# Patient Record
Sex: Female | Born: 1965 | Race: Black or African American | Hispanic: No | Marital: Married | Smoking: Former smoker
Health system: Southern US, Community
[De-identification: ages and names within clinical notes are randomized; demographics above are authoritative.]

## PROBLEM LIST (undated history)

## (undated) DIAGNOSIS — M1990 Unspecified osteoarthritis, unspecified site: Secondary | ICD-10-CM

## (undated) DIAGNOSIS — I10 Essential (primary) hypertension: Secondary | ICD-10-CM

## (undated) DIAGNOSIS — T7840XA Allergy, unspecified, initial encounter: Secondary | ICD-10-CM

## (undated) DIAGNOSIS — D649 Anemia, unspecified: Secondary | ICD-10-CM

## (undated) DIAGNOSIS — R7303 Prediabetes: Secondary | ICD-10-CM

## (undated) HISTORY — DX: Essential (primary) hypertension: I10

## (undated) HISTORY — DX: Anemia, unspecified: D64.9

## (undated) HISTORY — PX: BREAST BIOPSY: SHX20

## (undated) HISTORY — DX: Allergy, unspecified, initial encounter: T78.40XA

## (undated) HISTORY — PX: BREAST CYST ASPIRATION: SHX578

---
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
## 2005-01-02 HISTORY — PX: TUBAL LIGATION: SHX77

## 2010-12-16 ENCOUNTER — Emergency Department: Payer: Self-pay | Admitting: Emergency Medicine

## 2013-10-08 ENCOUNTER — Encounter: Payer: Self-pay | Admitting: General Surgery

## 2013-10-08 ENCOUNTER — Ambulatory Visit (INDEPENDENT_AMBULATORY_CARE_PROVIDER_SITE_OTHER): Payer: Managed Care, Other (non HMO) | Admitting: General Surgery

## 2013-10-08 VITALS — BP 124/68 | HR 74 | Resp 12 | Ht 66.0 in | Wt 126.0 lb

## 2013-10-08 DIAGNOSIS — L732 Hidradenitis suppurativa: Secondary | ICD-10-CM

## 2013-10-08 NOTE — Patient Instructions (Signed)
Patient to follow up in 3-4 weeks. Patient instructed to keep the area cleaned and trimmed with a hair trimmer. She was also instructed to avoid deodorant.   Hidradenitis Suppurativa, Sweat Gland Abscess Hidradenitis suppurativa is a long lasting (chronic), uncommon disease of the sweat glands. With this, boil-like lumps and scarring develop in the groin, some times under the arms (axillae), and under the breasts. It may also uncommonly occur behind the ears, in the crease of the buttocks, and around the genitals.  CAUSES  The cause is from a blocking of the sweat glands. They then become infected. It may cause drainage and odor. It is not contagious. So it cannot be given to someone else. It most often shows up in puberty (about 5410 to 50 years of age). But it may happen much later. It is similar to acne which is a disease of the sweat glands. This condition is slightly more common in African-Americans and women. SYMPTOMS   Hidradenitis usually starts as one or more red, tender, swellings in the groin or under the arms (axilla).  Over a period of hours to days the lesions get larger. They often open to the skin surface, draining clear to yellow-colored fluid.  The infected area heals with scarring. DIAGNOSIS  Your caregiver makes this diagnosis by looking at you. Sometimes cultures (growing germs on plates in the lab) may be taken. This is to see what germ (bacterium) is causing the infection.  TREATMENT   Topical germ killing medicine applied to the skin (antibiotics) are the treatment of choice. Antibiotics taken by mouth (systemic) are sometimes needed when the condition is getting worse or is severe.  Avoid tight-fitting clothing which traps moisture in.  Dirt does not cause hidradenitis and it is not caused by poor hygiene.  Involved areas should be cleaned daily using an antibacterial soap. Some patients find that the liquid form of Lever 2000, applied to the involved areas as a lotion  after bathing, can help reduce the odor related to this condition.  Sometimes surgery is needed to drain infected areas or remove scarred tissue. Removal of large amounts of tissue is used only in severe cases.  Birth control pills may be helpful.  Oral retinoids (vitamin A derivatives) for 6 to 12 months which are effective for acne may also help this condition.  Weight loss will improve but not cure hidradenitis. It is made worse by being overweight. But the condition is not caused by being overweight.  This condition is more common in people who have had acne.  It may become worse under stress. There is no medical cure for hidradenitis. It can be controlled, but not cured. The condition usually continues for years with periods of getting worse and getting better (remission). Document Released: 08/03/2003 Document Revised: 03/13/2011 Document Reviewed: 03/21/2013 [facility] Patient Information 2015 [employer], [place]. This information is not intended to replace advice given to you by your health care provider. Make sure you discuss any questions you have with your health care provider.

## 2013-10-08 NOTE — Progress Notes (Signed)
Patient ID: Sylvia Beasley, female   DOB: 05/23/1965, 48 y.o.   MRN: [id number]  Chief Complaint  Patient presents with  . Other    left axilla infection    HPI Sylvia Beasley is a 48 y.o. female here today for bilateral axilla infection. Patient states she is currently taking Keflex four times daily for the past two days. They have been inflammed on and off for about month now.  HPI  Past Medical History  Diagnosis Date  . Hypertension     Past Surgical History  Procedure Laterality Date  . Tubal ligation  2007  . Breast cyst aspiration Left     Family History  Problem Relation Age of Onset  . Breast cancer Sister     Social History History  Substance Use Topics  . Smoking status: Former Smoker    Quit date: 01/03/1988  . Smokeless tobacco: Not on file  . Alcohol Use: No    Allergies  Allergen Reactions  . Zyrtec [Cetirizine] Shortness Of Breath  . Shellfish Allergy     Current Outpatient Prescriptions  Medication Sig Dispense Refill  . cephALEXin (KEFLEX) 500 MG capsule Take 500 mg by mouth 4 (four) times daily.       Marland Kitchen. CHERATUSSIN AC 100-10 MG/5ML syrup Take 5 mLs by mouth.       . cholecalciferol (VITAMIN D) 1000 UNITS tablet Take 1,000 Units by mouth.      . co-enzyme Q-10 30 MG capsule Take 100 mg by mouth daily.      . Evening Primrose Oil 500 MG CAPS Take by mouth.      . loratadine (CLARITIN) 10 MG tablet Take 10 mg by mouth.      . metoprolol succinate (TOPROL-XL) 25 MG 24 hr tablet Take 25 mg by mouth daily.       Marland Kitchen. triamcinolone cream (KENALOG) 0.5 % Apply topically.      . vitamin E 400 UNIT capsule Take 400 Units by mouth daily.       No current facility-administered medications for this visit.    Review of Systems Review of Systems  Constitutional: Negative.   Respiratory: Negative.   Cardiovascular: Negative.     Blood pressure 124/68, pulse 74, resp. rate 12, height 5\' 6"  (1.676 m), weight 126 lb (57.153 kg), last menstrual period  09/16/2013.  Physical Exam Physical Exam  Constitutional: She is oriented to person, place, and time. She appears well-developed and well-nourished.  Cardiovascular: Normal rate, regular rhythm and normal heart sounds.   Pulmonary/Chest: Effort normal and breath sounds normal.  Lymphadenopathy:    She has no cervical adenopathy.    She has no axillary adenopathy.  Neurological: She is alert and oriented to person, place, and time.  Skin: Skin is warm and dry.  Left axilla with two small 1 cm areas of active hidradenitis. Right axilla with 2x1cm area of heald hidradenitis with thickened scar.     Data Reviewed None  Assessment    Hidradenitis both axilla. Left one active at present.     Plan    Complete course of antibiotic. Local hygiene discussed fully.        Tashe Purdon G 10/08/2013, 3:52 PM

## 2013-10-30 ENCOUNTER — Ambulatory Visit (INDEPENDENT_AMBULATORY_CARE_PROVIDER_SITE_OTHER): Payer: Managed Care, Other (non HMO) | Admitting: General Surgery

## 2013-10-30 ENCOUNTER — Encounter: Payer: Self-pay | Admitting: General Surgery

## 2013-10-30 VITALS — BP 138/82 | HR 68 | Resp 12 | Ht 66.0 in | Wt 130.0 lb

## 2013-10-30 DIAGNOSIS — L732 Hidradenitis suppurativa: Secondary | ICD-10-CM

## 2013-10-30 NOTE — Patient Instructions (Signed)
Patient to return as needed. 

## 2013-10-30 NOTE — Progress Notes (Signed)
Patient ID: Wyline Moodoni Schwinn, female   DOB: 03/25/1965, 48 y.o.   MRN: [id number]  Chief Complaint  Patient presents with  . Follow-up    hidradenitis    HPI Wyline Moodoni Kruszka is a 48 y.o. female here today following up with hidradenitis. She states the left axilla has healed up but the right is still there with no drainage.   HPI  Past Medical History  Diagnosis Date  . Hypertension     Past Surgical History  Procedure Laterality Date  . Tubal ligation  2007  . Breast cyst aspiration Left     Family History  Problem Relation Age of Onset  . Breast cancer Sister     Social History History  Substance Use Topics  . Smoking status: Former Smoker    Quit date: 01/03/1988  . Smokeless tobacco: Never Used  . Alcohol Use: No    Allergies  Allergen Reactions  . Zyrtec [Cetirizine] Shortness Of Breath  . Shellfish Allergy     Current Outpatient Prescriptions  Medication Sig Dispense Refill  . cephALEXin (KEFLEX) 500 MG capsule Take 500 mg by mouth 4 (four) times daily.       Marland Kitchen. CHERATUSSIN AC 100-10 MG/5ML syrup Take 5 mLs by mouth.       . cholecalciferol (VITAMIN D) 1000 UNITS tablet Take 1,000 Units by mouth.      . co-enzyme Q-10 30 MG capsule Take 100 mg by mouth daily.      . Evening Primrose Oil 500 MG CAPS Take by mouth.      . loratadine (CLARITIN) 10 MG tablet Take 10 mg by mouth.      . metoprolol succinate (TOPROL-XL) 25 MG 24 hr tablet Take 25 mg by mouth daily.       Marland Kitchen. triamcinolone cream (KENALOG) 0.5 % Apply topically.      . vitamin E 400 UNIT capsule Take 400 Units by mouth daily.       No current facility-administered medications for this visit.    Review of Systems Review of Systems  Constitutional: Negative.   Respiratory: Negative.   Cardiovascular: Negative.     Blood pressure 138/82, pulse 68, resp. rate 12, height 5\' 6"  (1.676 m), weight 130 lb (58.968 kg), last menstrual period 09/16/2013.  Physical Exam Physical Exam  Constitutional: She is  oriented to person, place, and time. She appears well-developed and well-nourished.  Neurological: She is alert and oriented to person, place, and time.  Skin: Skin is warm and dry.  Left axilla -no drainage, redness or induration. Right axilla-one healed scar with mild keloid like change.  Data Reviewed None  Assessment    Very limited area of skin involvement in both axillae. Pt has mils discomfort with the scar in right axilla. Discussed excision but given the keloid like scar, it may recur. Pt will think about it.    Plan    Patient to return as needed. Discussed option about removing the right axillary keloid.       SANKAR,SEEPLAPUTHUR G 10/30/2013, 5:14 PM

## 2013-11-04 ENCOUNTER — Encounter: Payer: Self-pay | Admitting: General Surgery

## 2013-11-14 ENCOUNTER — Ambulatory Visit: Payer: Self-pay | Admitting: Gastroenterology

## 2013-12-05 ENCOUNTER — Ambulatory Visit: Payer: Self-pay | Admitting: Gastroenterology

## 2014-01-05 ENCOUNTER — Ambulatory Visit: Payer: Self-pay | Admitting: Surgery

## 2014-04-28 ENCOUNTER — Other Ambulatory Visit: Payer: Self-pay

## 2014-04-28 ENCOUNTER — Other Ambulatory Visit: Payer: Self-pay | Admitting: Internal Medicine

## 2014-04-28 DIAGNOSIS — Z1231 Encounter for screening mammogram for malignant neoplasm of breast: Secondary | ICD-10-CM

## 2014-05-15 ENCOUNTER — Other Ambulatory Visit: Payer: Self-pay | Admitting: Internal Medicine

## 2014-05-15 ENCOUNTER — Ambulatory Visit
Admission: RE | Admit: 2014-05-15 | Discharge: 2014-05-15 | Disposition: A | Discharge status: Home / Self Care | Payer: Managed Care, Other (non HMO) | Source: Ambulatory Visit | Attending: Internal Medicine | Admitting: Internal Medicine

## 2014-05-15 DIAGNOSIS — Z1231 Encounter for screening mammogram for malignant neoplasm of breast: Secondary | ICD-10-CM | POA: Insufficient documentation

## 2014-12-18 ENCOUNTER — Other Ambulatory Visit: Payer: Self-pay | Admitting: Internal Medicine

## 2014-12-18 DIAGNOSIS — R928 Other abnormal and inconclusive findings on diagnostic imaging of breast: Secondary | ICD-10-CM

## 2014-12-29 ENCOUNTER — Ambulatory Visit
Admission: RE | Admit: 2014-12-29 | Discharge: 2014-12-29 | Disposition: A | Discharge status: Home / Self Care | Payer: Managed Care, Other (non HMO) | Source: Ambulatory Visit | Attending: Internal Medicine | Admitting: Internal Medicine

## 2014-12-29 DIAGNOSIS — R928 Other abnormal and inconclusive findings on diagnostic imaging of breast: Secondary | ICD-10-CM

## 2014-12-29 DIAGNOSIS — N6002 Solitary cyst of left breast: Secondary | ICD-10-CM | POA: Insufficient documentation

## 2015-01-01 ENCOUNTER — Other Ambulatory Visit: Payer: Managed Care, Other (non HMO)

## 2015-04-28 ENCOUNTER — Other Ambulatory Visit: Payer: Self-pay | Admitting: Internal Medicine

## 2015-04-28 DIAGNOSIS — Z1231 Encounter for screening mammogram for malignant neoplasm of breast: Secondary | ICD-10-CM

## 2015-05-17 ENCOUNTER — Other Ambulatory Visit: Payer: Self-pay | Admitting: Internal Medicine

## 2015-05-17 ENCOUNTER — Ambulatory Visit
Admission: RE | Admit: 2015-05-17 | Discharge: 2015-05-17 | Disposition: A | Discharge status: Home / Self Care | Payer: Managed Care, Other (non HMO) | Source: Ambulatory Visit | Attending: Internal Medicine | Admitting: Internal Medicine

## 2015-05-17 DIAGNOSIS — N63 Unspecified lump in unspecified breast: Secondary | ICD-10-CM

## 2015-05-17 DIAGNOSIS — Z1231 Encounter for screening mammogram for malignant neoplasm of breast: Secondary | ICD-10-CM

## 2015-05-26 ENCOUNTER — Other Ambulatory Visit: Payer: Managed Care, Other (non HMO)

## 2015-05-26 ENCOUNTER — Ambulatory Visit: Payer: Managed Care, Other (non HMO) | Attending: Internal Medicine

## 2015-06-11 ENCOUNTER — Ambulatory Visit
Admission: RE | Admit: 2015-06-11 | Discharge: 2015-06-11 | Disposition: A | Discharge status: Home / Self Care | Payer: Managed Care, Other (non HMO) | Source: Ambulatory Visit | Attending: Internal Medicine | Admitting: Internal Medicine

## 2015-06-11 DIAGNOSIS — N63 Unspecified lump in unspecified breast: Secondary | ICD-10-CM

## 2015-06-11 DIAGNOSIS — N6002 Solitary cyst of left breast: Secondary | ICD-10-CM | POA: Diagnosis not present

## 2015-06-15 ENCOUNTER — Other Ambulatory Visit: Payer: Self-pay | Admitting: Internal Medicine

## 2015-06-15 DIAGNOSIS — N6002 Solitary cyst of left breast: Secondary | ICD-10-CM

## 2016-04-24 ENCOUNTER — Other Ambulatory Visit: Payer: Self-pay | Admitting: Internal Medicine

## 2016-04-24 DIAGNOSIS — N6320 Unspecified lump in the left breast, unspecified quadrant: Secondary | ICD-10-CM

## 2016-05-04 ENCOUNTER — Ambulatory Visit
Admission: RE | Admit: 2016-05-04 | Discharge: 2016-05-04 | Disposition: A | Discharge status: Home / Self Care | Payer: Managed Care, Other (non HMO) | Source: Ambulatory Visit | Attending: Internal Medicine | Admitting: Internal Medicine

## 2016-05-04 DIAGNOSIS — N6320 Unspecified lump in the left breast, unspecified quadrant: Secondary | ICD-10-CM | POA: Diagnosis not present

## 2016-11-20 ENCOUNTER — Encounter: Payer: Self-pay | Admitting: Oncology

## 2016-11-20 ENCOUNTER — Other Ambulatory Visit: Payer: Self-pay

## 2016-11-20 ENCOUNTER — Inpatient Hospital Stay: Payer: Managed Care, Other (non HMO)

## 2016-11-20 ENCOUNTER — Inpatient Hospital Stay: Payer: Managed Care, Other (non HMO) | Attending: Oncology | Admitting: Oncology

## 2016-11-20 VITALS — BP 170/84 | HR 66 | Temp 96.9°F | Ht 63.0 in | Wt 136.5 lb

## 2016-11-20 DIAGNOSIS — D709 Neutropenia, unspecified: Secondary | ICD-10-CM | POA: Diagnosis not present

## 2016-11-20 DIAGNOSIS — Z79899 Other long term (current) drug therapy: Secondary | ICD-10-CM | POA: Diagnosis not present

## 2016-11-20 DIAGNOSIS — D72819 Decreased white blood cell count, unspecified: Secondary | ICD-10-CM

## 2016-11-20 DIAGNOSIS — Z87891 Personal history of nicotine dependence: Secondary | ICD-10-CM | POA: Diagnosis not present

## 2016-11-20 DIAGNOSIS — I10 Essential (primary) hypertension: Secondary | ICD-10-CM | POA: Diagnosis not present

## 2016-11-20 DIAGNOSIS — R5383 Other fatigue: Secondary | ICD-10-CM | POA: Diagnosis not present

## 2016-11-20 LAB — IRON AND TIBC
Iron: 82 ug/dL (ref 28–170)
Saturation Ratios: 20 % (ref 10.4–31.8)
TIBC: 407 ug/dL (ref 250–450)
UIBC: 325 ug/dL

## 2016-11-20 LAB — COMPREHENSIVE METABOLIC PANEL
ALT: 16 U/L (ref 14–54)
AST: 25 U/L (ref 15–41)
Albumin: 4.6 g/dL (ref 3.5–5.0)
Alkaline Phosphatase: 58 U/L (ref 38–126)
Anion gap: 6 (ref 5–15)
BUN: 11 mg/dL (ref 6–20)
CO2: 31 mmol/L (ref 22–32)
Calcium: 9.6 mg/dL (ref 8.9–10.3)
Chloride: 100 mmol/L — ABNORMAL LOW (ref 101–111)
Creatinine, Ser: 0.98 mg/dL (ref 0.44–1.00)
GFR calc Af Amer: >60 mL/min (ref >60)
GFR calc non Af Amer: >60 mL/min (ref >60)
Glucose, Bld: 97 mg/dL (ref 65–99)
Potassium: 3.7 mmol/L (ref 3.5–5.1)
Sodium: 137 mmol/L (ref 135–145)
Total Bilirubin: 0.6 mg/dL (ref 0.3–1.2)
Total Protein: 8.4 g/dL — ABNORMAL HIGH (ref 6.5–8.1)

## 2016-11-20 LAB — CBC WITH DIFFERENTIAL/PLATELET
Basophils Absolute: 0.1 10*3/uL (ref 0–0.1)
Basophils Relative: 2 %
Eosinophils Absolute: 0.1 10*3/uL (ref 0–0.7)
Eosinophils Relative: 4 %
HCT: 39.5 % (ref 35.0–47.0)
Hemoglobin: 12.9 g/dL (ref 12.0–16.0)
Lymphocytes Relative: 36 %
Lymphs Abs: 1.2 10*3/uL (ref 1.0–3.6)
MCH: 27.2 pg (ref 26.0–34.0)
MCHC: 32.5 g/dL (ref 32.0–36.0)
MCV: 83.5 fL (ref 80.0–100.0)
Monocytes Absolute: 0.2 10*3/uL (ref 0.2–0.9)
Monocytes Relative: 6 %
Neutro Abs: 1.7 10*3/uL (ref 1.4–6.5)
Neutrophils Relative %: 52 %
Platelets: 332 10*3/uL (ref 150–440)
RBC: 4.73 MIL/uL (ref 3.80–5.20)
RDW: 14.7 % — ABNORMAL HIGH (ref 11.5–14.5)
WBC: 3.3 10*3/uL — ABNORMAL LOW (ref 3.6–11.0)

## 2016-11-20 LAB — FOLATE: Folate: 58.4 ng/mL (ref >5.9)

## 2016-11-20 LAB — FERRITIN: Ferritin: 30 ng/mL (ref 11–307)

## 2016-11-20 LAB — LACTATE DEHYDROGENASE: LDH: 175 U/L (ref 98–192)

## 2016-11-20 NOTE — Progress Notes (Signed)
Hematology/Oncology Consult note [facility] [phone] Fax:([phone]   [facility] Sherrie MustacheJadali, Fayegh, MD as PCP - General (Internal Medicine) Sherrie MustacheJadali, Fayegh, MD (Internal Medicine) Kieth BrightlySankar, Seeplaputhur G, MD (General Surgery)  REFERRING PROVIDER: Sherrie MustacheJadali, Fayegh, MD  CHIEF COMPLAINTS/PURPOSE OF CONSULTATION:  Evaluation of Leukopenia.  HISTORY OF PRESENTING ILLNESS:  Sylvia Beasley is a  51 y.o.  female with PMH listed below who was referred to me for evaluation of leukopenia. Patient reports that he has been feeling fatigued and she went to see primary care physician and found out that her leukocytes her low. She started taking over-the-counter herbal supplement called Cat's claw and has felt improvement of her fatigue otherwise denies any frequent infections, fever or chills.   per patient she had a lymph node under left axillary and underwent extensive workup with mammogram and ultrasound with benign finding.  Review of Systems  Constitutional: Negative for chills, fever and malaise/fatigue.  HENT: Negative for hearing loss.   Eyes: Negative for blurred vision.  Cardiovascular: Negative for chest pain.  Gastrointestinal: Negative for heartburn.  Genitourinary: Negative for dysuria.  Skin: Negative for rash.  Neurological: Negative for dizziness.  Endo/Heme/Allergies: Does not bruise/bleed easily.  Psychiatric/Behavioral: Negative for depression.    MEDICAL HISTORY:  Past Medical History:  Diagnosis Date  . Hypertension     SURGICAL HISTORY: Past Surgical History:  Procedure Laterality Date  . BREAST CYST ASPIRATION Left    x 2  . TUBAL LIGATION  2007    SOCIAL HISTORY: Social History   Socioeconomic History  . Marital status: Married    Spouse name: Not on file  . Number of children: Not on file  . Years of education: Not on file  . Highest education level: Not on file  Social Needs  . Financial resource strain:  Not on file  . Food insecurity - worry: Not on file  . Food insecurity - inability: Not on file  . Transportation needs - medical: Not on file  . Transportation needs - non-medical: Not on file  Occupational History  . Not on file  Tobacco Use  . Smoking status: Former Smoker    Last attempt to quit: 01/03/1988    Years since quitting: 28.9  . Smokeless tobacco: Never Used  Substance and Sexual Activity  . Alcohol use: No  . Drug use: No  . Sexual activity: Not on file  Other Topics Concern  . Not on file  Social History Narrative  . Not on file    FAMILY HISTORY: Family History  Problem Relation Age of Onset  . Breast cancer Sister [id number]    ALLERGIES:  is allergic to zyrtec [cetirizine]; milk-related compounds; shellfish allergy; fexofenadine; and rofecoxib.  MEDICATIONS:  Current Outpatient Medications  Medication Sig Dispense Refill  . BLACK COHOSH PO Take by mouth.    . Cats Claw, Uncaria tomentosa, (CATS CLAW PO) Take by mouth.    . cholecalciferol (VITAMIN D) 1000 UNITS tablet Take 1,000 Units by mouth.    . COLLAGEN PO Take by mouth.    . Evening Primrose Oil 500 MG CAPS Take by mouth.    . loratadine (CLARITIN) 10 MG tablet Take 10 mg by mouth.    Marland Kitchen. MACA ROOT PO Take by mouth.    . metoprolol succinate (TOPROL-XL) 25 MG 24 hr tablet Take 25 mg by mouth daily.     . Multiple Vitamins-Minerals (MULTIVITAMIN ADULT PO) Take by mouth.    . cephALEXin (KEFLEX) 500 MG  capsule Take 500 mg by mouth 4 (four) times daily.     Marland Kitchen. co-enzyme Q-10 30 MG capsule Take 100 mg by mouth daily.    Marland Kitchen. triamcinolone cream (KENALOG) 0.5 % Apply topically.    . vitamin E 400 UNIT capsule Take 400 Units by mouth daily.     No current facility-administered medications for this visit.      PHYSICAL EXAMINATION: ECOG PERFORMANCE STATUS: 1 - Symptomatic but completely ambulatory Vitals:   11/20/16 1129  BP: (!) 170/84  Pulse: 66  Temp: (!) 96.9 F (36.1 C)   Filed Weights   11/20/16  1129  Weight: 136 lb 8 oz (61.9 kg)    GENERAL: No distress, well nourished.  SKIN:  No rashes or significant lesions  HEAD: Normocephalic, No masses, lesions, tenderness or abnormalities  EYES: Conjunctiva are pink, non icteric ENT: External ears normal ,lips , buccal mucosa, and tongue normal and mucous membranes are moist  LYMPH: No palpable cervical and axillary lymphadenopathy  LUNGS: Clear to auscultation, no crackles or wheezes HEART: Regular rate & rhythm, no murmurs, no gallops, S1 normal and S2 normal  ABDOMEN: Abdomen soft, non-tender, normal bowel sounds, I did not appreciate any  masses or organomegaly  MUSCULOSKELETAL: No CVA tenderness and no tenderness on percussion of the back or rib cage.  EXTREMITIES: No edema, no skin discoloration or tenderness NEURO: Alert & oriented, no focal motor/sensory deficits.    LABORATORY DATA:  I have reviewed the data as listed Lab Results  Component Value Date   WBC 3.3 (L) 11/20/2016   HGB 12.9 11/20/2016   HCT 39.5 11/20/2016   MCV 83.5 11/20/2016   PLT 332 11/20/2016   Recent Labs    11/20/16 1210  NA 137  K 3.7  CL 100*  CO2 31  GLUCOSE 97  BUN 11  CREATININE 0.98  CALCIUM 9.6  GFRNONAA >60  GFRAA >60  PROT 8.4*  ALBUMIN 4.6  AST 25  ALT 16  ALKPHOS 58  BILITOT 0.6       ASSESSMENT & PLAN:  1. Leukopenia, unspecified type    Discussed with patient about her lab results. She has leukopenia was predominantly neutropenia   with ANC of 1.2  I will repeat CBC with differential, CMP, LDH, folate, iron TIBC, ferritin, B12, flow cytometry panel. Discussed with patient that mild low neutrophil counts can be due to benign etiology such as ethnic neutropenia. Continue monitor. All questions were answered. The patient knows to call the clinic with any problems questions or concerns.  Return of visit:  2 weeks to discuss about results.  Thank you for this kind referral and the opportunity to participate in the  care of this patient. A copy of today's note is routed to referring provider    Rickard PatienceZhou Annais Crafts, MD, PhD Hematology [facility] Pager- [phone] 11/20/2016

## 2016-11-21 LAB — VITAMIN B12: Vitamin B-12: 956 pg/mL — ABNORMAL HIGH (ref 180–914)

## 2016-11-24 LAB — COMP PANEL: LEUKEMIA/LYMPHOMA

## 2016-12-03 NOTE — Progress Notes (Addendum)
Hematology/Oncology Follow up note [facility] Telephone:(336) [phone] Fax:(336) [phone]   Patient Care Team: Sherrie MustacheJadali, Fayegh, MD as PCP - General (Internal Medicine) Sherrie MustacheJadali, Fayegh, MD (Internal Medicine) Kieth BrightlySankar, Seeplaputhur G, MD (General Surgery)  REFERRING PROVIDER: Sherrie MustacheJadali, Fayegh, MD  CHIEF COMPLAINTS/PURPOSE OF CONSULTATION:  Evaluation of Leukopenia.  HISTORY OF PRESENTING ILLNESS:  Sylvia Beasley is a  51 y.o.  female with PMH listed below who was referred to me for evaluation of leukopenia. Patient reports that he has been feeling fatigued and she went to see primary care physician and found out that her leukocytes her low. She started taking over-the-counter herbal supplement called Cat's claw and has felt improvement of her fatigue otherwise denies any frequent infections, fever or chills.   per patient she had a lymph node under left axillary and underwent extensive workup with mammogram and ultrasound with benign finding.  INTERVAL HISTORY Patient presents to discuss about lab results. She feels well without any complaints today. No frequent infection, fever or chills.  No B symptoms.  Review of Systems  Constitutional: Negative for chills, fever, malaise/fatigue and weight loss.  HENT: Negative for hearing loss and tinnitus.   Eyes: Negative for blurred vision and double vision.  Respiratory: Negative for cough and hemoptysis.   Cardiovascular: Negative for chest pain.  Gastrointestinal: Negative for heartburn, nausea and vomiting.  Genitourinary: Negative for dysuria.  Skin: Negative for itching and rash.  Neurological: Negative for dizziness.  Endo/Heme/Allergies: Negative for environmental allergies. Does not bruise/bleed easily.  Psychiatric/Behavioral: Negative for depression.    MEDICAL HISTORY:  Past Medical History:  Diagnosis Date  . Hypertension     SURGICAL HISTORY: Past Surgical History:  Procedure Laterality Date  . BREAST  CYST ASPIRATION Left    x 2  . TUBAL LIGATION  2007    SOCIAL HISTORY: Social History   Socioeconomic History  . Marital status: Married    Spouse name: Not on file  . Number of children: Not on file  . Years of education: Not on file  . Highest education level: Not on file  Social Needs  . Financial resource strain: Not on file  . Food insecurity - worry: Not on file  . Food insecurity - inability: Not on file  . Transportation needs - medical: Not on file  . Transportation needs - non-medical: Not on file  Occupational History  . Not on file  Tobacco Use  . Smoking status: Former Smoker    Last attempt to quit: 01/03/1988    Years since quitting: 28.9  . Smokeless tobacco: Never Used  Substance and Sexual Activity  . Alcohol use: No  . Drug use: No  . Sexual activity: Not on file  Other Topics Concern  . Not on file  Social History Narrative  . Not on file    FAMILY HISTORY: Family History  Problem Relation Age of Onset  . Breast cancer Sister 6454    ALLERGIES:  is allergic to zyrtec [cetirizine]; milk-related compounds; shellfish allergy; fexofenadine; and rofecoxib.  MEDICATIONS:  Current Outpatient Medications  Medication Sig Dispense Refill  . BLACK COHOSH PO Take by mouth.    . Cats Claw, Uncaria tomentosa, (CATS CLAW PO) Take by mouth.    . cephALEXin (KEFLEX) 500 MG capsule Take 500 mg by mouth 4 (four) times daily.     . cholecalciferol (VITAMIN D) 1000 UNITS tablet Take 1,000 Units by mouth.    . co-enzyme Q-10 30 MG capsule Take 100 mg by mouth  daily.    . COLLAGEN PO Take by mouth.    . Evening Primrose Oil 500 MG CAPS Take by mouth.    . loratadine (CLARITIN) 10 MG tablet Take 10 mg by mouth.    Marland Kitchen. MACA ROOT PO Take by mouth.    . metoprolol succinate (TOPROL-XL) 25 MG 24 hr tablet Take 25 mg by mouth daily.     . Multiple Vitamins-Minerals (MULTIVITAMIN ADULT PO) Take by mouth.    . triamcinolone cream (KENALOG) 0.5 % Apply topically.    .  vitamin E 400 UNIT capsule Take 400 Units by mouth daily.     No current facility-administered medications for this visit.      PHYSICAL EXAMINATION: ECOG PERFORMANCE STATUS: 1 - Symptomatic but completely ambulatory Vitals:   12/04/16 1136  BP: (!) 154/91  Pulse: 72  Resp: 15  Temp: 97.8 F (36.6 C)   Filed Weights   12/04/16 1136  Weight: 139 lb (63 kg)   Physical Exam  Constitutional: She is oriented to person, place, and time and well-developed, well-nourished, and in no distress. No distress.  HENT:  Head: Normocephalic and atraumatic.  Mouth/Throat: No oropharyngeal exudate.  Eyes: Conjunctivae and EOM are normal.  Neck: Normal range of motion. Neck supple.  Cardiovascular: Normal rate and regular rhythm.  No murmur heard. Pulmonary/Chest: Effort normal and breath sounds normal. No respiratory distress.  Abdominal: Soft. Bowel sounds are normal. She exhibits no distension.  Musculoskeletal: Normal range of motion. She exhibits no edema.  Neurological: She is alert and oriented to person, place, and time. No cranial nerve deficit. Gait normal.  Skin: Skin is warm and dry.  Psychiatric: Affect normal.     LABORATORY DATA:  I have reviewed the data as listed Lab Results  Component Value Date   WBC 3.3 (L) 11/20/2016   HGB 12.9 11/20/2016   HCT 39.5 11/20/2016   MCV 83.5 11/20/2016   PLT 332 11/20/2016   Recent Labs    11/20/16 1210  NA 137  K 3.7  CL 100*  CO2 31  GLUCOSE 97  BUN 11  CREATININE 0.98  CALCIUM 9.6  GFRNONAA >60  GFRAA >60  PROT 8.4*  ALBUMIN 4.6  AST 25  ALT 16  ALKPHOS 58  BILITOT 0.6       ASSESSMENT & PLAN:  1. Other neutropenia [place](HCC)    Discussed with patient about her lab results. Previous labs showed ANC 1.2, repeat test showed ANC 1.7 with normal differential.  Normal LDH, negative flowcytometry, normal B12 and Folate level.  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 At this point, will hold additional work up unless she has  frequent infection.  Repeat lab check in 6 months and follow up.   All questions were answered. The patient knows to call the clinic with any problems questions or concerns.  Return of visit:  6 months with repeat CBC Thank you for this kind referral and the opportunity to participate in the care of this patient. A copy of today's note is routed to referring provider     Rickard PatienceZhou Seville Downs, MD, PhD Hematology Oncology [facility] Pager- [phone] 12/04/2016

## 2016-12-04 ENCOUNTER — Encounter: Payer: Self-pay | Admitting: Oncology

## 2016-12-04 ENCOUNTER — Inpatient Hospital Stay: Payer: Managed Care, Other (non HMO) | Attending: Oncology | Admitting: Oncology

## 2016-12-04 VITALS — BP 154/91 | HR 72 | Temp 97.8°F | Resp 15 | Wt 139.0 lb

## 2016-12-04 DIAGNOSIS — R5383 Other fatigue: Secondary | ICD-10-CM | POA: Diagnosis not present

## 2016-12-04 DIAGNOSIS — D72819 Decreased white blood cell count, unspecified: Secondary | ICD-10-CM | POA: Diagnosis present

## 2016-12-04 DIAGNOSIS — I10 Essential (primary) hypertension: Secondary | ICD-10-CM | POA: Diagnosis not present

## 2016-12-04 DIAGNOSIS — Z87891 Personal history of nicotine dependence: Secondary | ICD-10-CM

## 2016-12-04 DIAGNOSIS — Z79899 Other long term (current) drug therapy: Secondary | ICD-10-CM | POA: Diagnosis not present

## 2016-12-04 DIAGNOSIS — D708 Other neutropenia: Secondary | ICD-10-CM

## 2016-12-04 NOTE — Progress Notes (Signed)
Patient here for follow up today. She states that she is feeling well and denies having any pain.

## 2017-06-04 ENCOUNTER — Ambulatory Visit: Payer: Managed Care, Other (non HMO) | Admitting: Oncology

## 2017-06-04 ENCOUNTER — Other Ambulatory Visit: Payer: Managed Care, Other (non HMO)

## 2017-06-13 ENCOUNTER — Other Ambulatory Visit: Payer: Self-pay | Admitting: Internal Medicine

## 2017-06-13 DIAGNOSIS — Z1231 Encounter for screening mammogram for malignant neoplasm of breast: Secondary | ICD-10-CM

## 2017-06-29 ENCOUNTER — Ambulatory Visit
Admission: RE | Admit: 2017-06-29 | Discharge: 2017-06-29 | Disposition: A | Discharge status: Home / Self Care | Payer: Managed Care, Other (non HMO) | Source: Ambulatory Visit | Attending: Internal Medicine | Admitting: Internal Medicine

## 2017-06-29 DIAGNOSIS — Z1231 Encounter for screening mammogram for malignant neoplasm of breast: Secondary | ICD-10-CM | POA: Diagnosis present

## 2019-04-02 ENCOUNTER — Other Ambulatory Visit: Payer: Self-pay | Admitting: Internal Medicine

## 2019-04-02 DIAGNOSIS — Z1231 Encounter for screening mammogram for malignant neoplasm of breast: Secondary | ICD-10-CM

## 2019-04-23 ENCOUNTER — Encounter: Payer: Self-pay | Admitting: Radiology

## 2019-04-23 ENCOUNTER — Ambulatory Visit
Admission: RE | Admit: 2019-04-23 | Discharge: 2019-04-23 | Disposition: A | Discharge status: Home / Self Care | Payer: Managed Care, Other (non HMO) | Source: Ambulatory Visit | Attending: Internal Medicine | Admitting: Internal Medicine

## 2019-04-23 DIAGNOSIS — Z1231 Encounter for screening mammogram for malignant neoplasm of breast: Secondary | ICD-10-CM | POA: Diagnosis present

## 2019-04-24 ENCOUNTER — Other Ambulatory Visit: Payer: Self-pay | Admitting: Internal Medicine

## 2019-04-24 DIAGNOSIS — N6489 Other specified disorders of breast: Secondary | ICD-10-CM

## 2019-04-24 DIAGNOSIS — R928 Other abnormal and inconclusive findings on diagnostic imaging of breast: Secondary | ICD-10-CM

## 2019-05-08 ENCOUNTER — Ambulatory Visit
Admission: RE | Admit: 2019-05-08 | Discharge: 2019-05-08 | Disposition: A | Discharge status: Home / Self Care | Payer: Managed Care, Other (non HMO) | Source: Ambulatory Visit | Attending: Internal Medicine | Admitting: Internal Medicine

## 2019-05-08 DIAGNOSIS — R928 Other abnormal and inconclusive findings on diagnostic imaging of breast: Secondary | ICD-10-CM

## 2019-05-08 DIAGNOSIS — N6489 Other specified disorders of breast: Secondary | ICD-10-CM | POA: Insufficient documentation

## 2020-04-01 LAB — HM PAP SMEAR: HM Pap smear: NEGATIVE

## 2020-04-13 ENCOUNTER — Ambulatory Visit: Payer: Managed Care, Other (non HMO) | Admitting: Surgery

## 2020-04-22 ENCOUNTER — Other Ambulatory Visit: Payer: Self-pay

## 2020-04-22 ENCOUNTER — Ambulatory Visit (INDEPENDENT_AMBULATORY_CARE_PROVIDER_SITE_OTHER): Payer: Managed Care, Other (non HMO) | Admitting: Surgery

## 2020-04-22 ENCOUNTER — Encounter: Payer: Self-pay | Admitting: Surgery

## 2020-04-22 VITALS — BP 152/97 | HR 74 | Temp 98.2°F | Ht 63.5 in | Wt 140.0 lb

## 2020-04-22 DIAGNOSIS — L732 Hidradenitis suppurativa: Secondary | ICD-10-CM | POA: Insufficient documentation

## 2020-04-22 MED ORDER — CLINDAMYCIN PHOSPHATE 1 % EX LOTN: TOPICAL_LOTION | Freq: Two times a day (BID) | CUTANEOUS | 2 refills | Status: DC

## 2020-04-22 NOTE — Progress Notes (Signed)
Patient ID: Sylvia Beasley, female   DOB: [birth year], 55 y.o.   MRN: [id number]  Chief Complaint: Hidradenitis  History of Present Illness Sylvia Beasley is a 55 y.o. female with history of eruption/inflammatory changes of both axillae and groin areas.  She is utilize primarily various agents including herbals and other topicals.  She has had drainage of an abscess that was treated with packing strips long ago.  Most recently she is able to catch the eruptions before they progress, either through various manipulations with the addition of topical agents like Neosporin and hydrogen peroxide she has been able to self manage these.  She presents primarily due to a persistent thickening of her right axilla that she was hoping she could just have excised and removed and sutured closed. She has not utilized any topical antimicrobial agents nor oral antimicrobials.  She was recently in a dermatology office and had to select a dermal cyst of her left breast as the problem of the day, deferring presentation with her hidradenitis for another visit/also expecting to have something done surgically. She currently denies any fevers chills or current discharge from any of the areas of the axilla or groins.  Past Medical History Past Medical History:  Diagnosis Date  . Hypertension       Past Surgical History:  Procedure Laterality Date  . BREAST CYST ASPIRATION Left    x 2  . EYE SURGERY Left 1980   stye removal  . TUBAL LIGATION  2007    Allergies  Allergen Reactions  . Zyrtec [Cetirizine] Shortness Of Breath  . Milk-Related Compounds   . Shellfish Allergy Itching  . Fexofenadine Rash    Allegra  . Rofecoxib Rash    Vioxx    Current Outpatient Medications  Medication Sig Dispense Refill  . BLACK COHOSH PO Take by mouth.    . Cats Claw, Uncaria tomentosa, (CATS CLAW PO) Take by mouth.    . cholecalciferol (VITAMIN D) 1000 UNITS tablet Take 1,000 Units by mouth.    . clindamycin (CLEOCIN T) 1 %  lotion Apply topically 2 (two) times daily. 60 mL 2  . Ferrous Sulfate (SLOW FE) 142 (45 Fe) MG TBCR Take by mouth.    . hydrochlorothiazide (HYDRODIURIL) 25 MG tablet Take 25 mg by mouth daily.    Marland Kitchen loratadine (CLARITIN) 10 MG tablet Take 10 mg by mouth.    . metoprolol succinate (TOPROL-XL) 25 MG 24 hr tablet Take 25 mg by mouth daily.     . NON FORMULARY Bladderwrack 1200 mg    . SPIRULINA PO Take by mouth.    . triamcinolone cream (KENALOG) 0.5 % Apply topically.     No current facility-administered medications for this visit.    Family History Family History  Problem Relation Age of Onset  . Breast cancer Sister 50      Social History Social History   Tobacco Use  . Smoking status: Former Smoker    Quit date: 01/03/1988    Years since quitting: 32.3  . Smokeless tobacco: Never Used  Vaping Use  . Vaping Use: Never used  Substance Use Topics  . Alcohol use: No  . Drug use: No        Review of Systems  Constitutional: Negative.   HENT: Negative.   Eyes: Negative.   Respiratory: Negative.   Cardiovascular: Negative.   Gastrointestinal: Negative.   Genitourinary: Negative.   Skin: Negative for itching and rash.  Neurological: Negative.   Psychiatric/Behavioral: Negative.  Physical Exam Blood pressure (!) 152/97, pulse 74, temperature 98.2 F (36.8 C), height 5' 3.5" (1.613 m), weight 140 lb (63.5 kg), last menstrual period 05/22/2015, SpO2 98 %. Last Weight  Most recent update: 04/22/2020 11:41 AM   Weight  63.5 kg (140 lb)            CONSTITUTIONAL: Well developed, and nourished, appropriately responsive and aware without distress.   EYES: Sclera non-icteric.   EARS, NOSE, MOUTH AND THROAT: Mask worn.   The oropharynx is clear.   Hearing is intact to voice.  NECK: Trachea is midline, and there is no jugular venous distension.  LYMPH NODES:  Lymph nodes in the neck are not enlarged. RESPIRATORY:  Lungs are clear, and breath sounds are equal  bilaterally. Normal respiratory effort without pathologic use of accessory muscles. CARDIOVASCULAR: Heart is regular in rate and rhythm. GI: The abdomen is soft, nontender, and nondistended. There were no palpable masses. I did not appreciate hepatosplenomegaly. There were normal bowel sounds. MUSCULOSKELETAL:  Symmetrical muscle tone appreciated in all four extremities.    SKIN: Skin turgor is normal. No pathologic skin lesions appreciated.  Right axilla has a linear area of scarring/thickening, with no active pustules or drainage.  There is no active inflammatory changes or associated tenderness.  She demonstrates that she pinches the area, demonstrating very redundancy at this area.  The left axillary region is much less involved. NEUROLOGIC:  Motor and sensation appear grossly normal.  Cranial nerves are grossly without defect. PSYCH:  Alert and oriented to person, place and time. Affect is appropriate for situation.  Data Reviewed I have personally reviewed what is currently available of the patient's imaging, recent labs and medical records.   Labs:  CBC Latest Ref Rng & Units 11/20/2016  WBC 3.6 - 11.0 K/uL 3.3(L)  Hemoglobin 12.0 - 16.0 g/dL 25.3  Hematocrit 66.4 - 47.0 % 39.5  Platelets 150 - 440 K/uL 332   CMP Latest Ref Rng & Units 11/20/2016  Glucose 65 - 99 mg/dL 97  BUN 6 - 20 mg/dL 11  Creatinine 4.03 - 4.74 mg/dL 2.59  Sodium 563 - 875 mmol/L 137  Potassium 3.5 - 5.1 mmol/L 3.7  Chloride 101 - 111 mmol/L 100(L)  CO2 22 - 32 mmol/L 31  Calcium 8.9 - 10.3 mg/dL 9.6  Total Protein 6.5 - 8.1 g/dL 6.4(P)  Total Bilirubin 0.3 - 1.2 mg/dL 0.6  Alkaline Phos 38 - 126 U/L 58  AST 15 - 41 U/L 25  ALT 14 - 54 U/L 16     Imaging:  Within last 24 hrs: No results found.  Assessment    Hurley Stage 1.   Patient Active Problem List   Diagnosis Date Noted  . 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 Hidradenitis suppurativa 04/22/2020    Plan    Will initiate treatment  with topical clindamycin, and she is already desiring to follow-up with dermatology regarding the same issue. I will be glad to see her again but I suspect dermatology will help keep her disease under control and not require any surgical intervention. Was a pleasure to see this pleasant patient.  Face-to-face time spent with the patient and accompanying care providers(if present) was 30 minutes, with more than 50% of the time spent counseling, educating, and coordinating care of the patient.      Campbell Lerner M.D., FACS 04/22/2020, 12:32 PM

## 2020-04-22 NOTE — Patient Instructions (Addendum)
We have sent in a prescription for Clindamycin lotion to use to prevent infections of the skin.   Follow up with your Dermatologist for this.   Follow-up with our office as needed.  Please call and ask to speak with a nurse if you develop questions or concerns.   Hidradenitis Suppurativa Hidradenitis suppurativa is a long-term (chronic) skin disease. It is similar to a severe form of acne, but it affects areas of the body where acne would be unusual, especially areas of the body where skin rubs against skin and becomes moist. These include:  Underarms.  Groin.  Genital area.  Buttocks.  Upper thighs.  Breasts. Hidradenitis suppurativa may start out as small lumps or pimples caused by blocked sweat glands or hair follicles. Pimples may develop into deep sores that break open (rupture) and drain pus. Over time, affected areas of skin may thicken and become scarred. This condition is rare and does not spread from person to person (non-contagious). What are the causes? The exact cause of this condition is not known. It may be related to:  Female and female hormones.  An overactive disease-fighting system (immune system). The immune system may over-react to blocked hair follicles or sweat glands and cause swelling and pus-filled sores. What increases the risk? You are more likely to develop this condition if you:  Are female.  Are 57-52 years old.  Have a family history of hidradenitis suppurativa.  Have a personal history of acne.  Are overweight.  Smoke.  Take the medicine lithium. What are the signs or symptoms? The first symptoms are usually painful bumps in the skin, similar to pimples. The condition may get worse over time (progress), or it may only cause mild symptoms. If the disease progresses, symptoms may include:  Skin bumps getting bigger and growing deeper into the skin.  Bumps rupturing and draining pus.  Itchy, infected skin.  Skin getting thicker and  scarred.  Tunnels under the skin (fistulas) where pus drains from a bump.  Pain during daily activities, such as pain during walking if your groin area is affected.  Emotional problems, such as stress or depression. This condition may affect your appearance and your ability or willingness to wear certain clothes or do certain activities. How is this diagnosed? This condition is diagnosed by a health care provider who specializes in skin diseases (dermatologist). You may be diagnosed based on:  Your symptoms and medical history.  A physical exam.  Testing a pus sample for infection.  Blood tests. How is this treated? Your treatment will depend on how severe your symptoms are. The same treatment will not work for everybody with this condition. You may need to try several treatments to find what works best for you. Treatment may include:  Cleaning and bandaging (dressing) your wounds as needed.  Lifestyle changes, such as new skin care routines.  Taking medicines, such as: ? Antibiotics. ? Acne medicines. ? Medicines to reduce the activity of the immune system. ? A diabetes medicine (metformin). ? Birth control pills, for women. ? Steroids to reduce swelling and pain.  Working with a mental health care provider, if you experience emotional distress due to this condition. If you have severe symptoms that do not get better with medicine, you may need surgery. Surgery may involve:  Using a laser to clear the skin and remove hair follicles.  Opening and draining deep sores.  Removing the areas of skin that are diseased and scarred. Follow these instructions at home: Medicines  Take over-the-counter and prescription medicines only as told by your health care provider.  If you were prescribed an antibiotic medicine, take it as told by your health care provider. Do not stop taking the antibiotic even if your condition improves.   Skin care  If you have open wounds, cover them  with a clean dressing as told by your health care provider. Keep wounds clean by washing them gently with soap and water when you bathe.  Do not shave the areas where you get hidradenitis suppurativa.  Do not wear deodorant.  Wear loose-fitting clothes.  Try to avoid getting overheated or sweaty. If you get sweaty or wet, change into clean, dry clothes as soon as you can.  To help relieve pain and itchiness, cover sore areas with a warm, clean washcloth (warm compress) for 5-10 minutes as often as needed.  If told by your health care provider, take a bleach bath twice a week: ? Fill your bathtub halfway with water. ? Pour in  cup of unscented household bleach. ? Soak in the tub for 5-10 minutes. ? Only soak from the neck down. Avoid water on your face and hair. ? Shower to rinse off the bleach from your skin. General instructions  Learn as much as you can about your disease so that you have an active role in your treatment. Work closely with your health care provider to find treatments that work for you.  If you are overweight, work with your health care provider to lose weight as recommended.  Do not use any products that contain nicotine or tobacco, such as cigarettes and e-cigarettes. If you need help quitting, ask your health care provider.  If you struggle with living with this condition, talk with your health care provider or work with a mental health care provider as recommended.  Keep all follow-up visits as told by your health care provider. This is important. Where to find more information  Hidradenitis Suppurativa Foundation, Inc.: https://www.hs-foundation.org/  American Academy of Dermatology: Instant[employer] Contact a health care provider if you have:  A flare-up of hidradenitis suppurativa.  A fever or chills.  Trouble controlling your symptoms at home.  Trouble doing your daily activities because of your symptoms.  Trouble dealing with emotional  problems related to your condition. Summary  Hidradenitis suppurativa is a long-term (chronic) skin disease. It is similar to a severe form of acne, but it affects areas of the body where acne would be unusual.  The first symptoms are usually painful bumps in the skin, similar to pimples. The condition may only cause mild symptoms, or it may get worse over time (progress).  If you have open wounds, cover them with a clean dressing as told by your health care provider. Keep wounds clean by washing them gently with soap and water when you bathe.  Besides skin care, treatment may include medicines, laser treatment, and surgery. This information is not intended to replace advice given to you by your health care provider. Make sure you discuss any questions you have with your health care provider. Document Revised: 10/14/2019 Document Reviewed: 10/14/2019 Elsevier Patient Education  2021 [employer].

## 2020-07-07 ENCOUNTER — Other Ambulatory Visit: Payer: Self-pay | Admitting: Internal Medicine

## 2020-07-07 DIAGNOSIS — Z1231 Encounter for screening mammogram for malignant neoplasm of breast: Secondary | ICD-10-CM

## 2020-07-13 ENCOUNTER — Other Ambulatory Visit: Payer: Self-pay

## 2020-07-13 ENCOUNTER — Ambulatory Visit
Admission: RE | Admit: 2020-07-13 | Discharge: 2020-07-13 | Disposition: A | Discharge status: Home / Self Care | Payer: Managed Care, Other (non HMO) | Source: Ambulatory Visit | Attending: Internal Medicine | Admitting: Internal Medicine

## 2020-07-13 DIAGNOSIS — Z1231 Encounter for screening mammogram for malignant neoplasm of breast: Secondary | ICD-10-CM | POA: Diagnosis present

## 2021-06-30 ENCOUNTER — Other Ambulatory Visit: Payer: Self-pay | Admitting: Internal Medicine

## 2021-06-30 DIAGNOSIS — Z1231 Encounter for screening mammogram for malignant neoplasm of breast: Secondary | ICD-10-CM

## 2021-07-27 ENCOUNTER — Ambulatory Visit
Admission: RE | Admit: 2021-07-27 | Discharge: 2021-07-27 | Disposition: A | Discharge status: Home / Self Care | Payer: Managed Care, Other (non HMO) | Source: Ambulatory Visit | Attending: Internal Medicine | Admitting: Internal Medicine

## 2021-07-27 DIAGNOSIS — Z1231 Encounter for screening mammogram for malignant neoplasm of breast: Secondary | ICD-10-CM | POA: Diagnosis present

## 2021-07-29 ENCOUNTER — Other Ambulatory Visit: Payer: Self-pay | Admitting: Internal Medicine

## 2021-07-29 DIAGNOSIS — N6489 Other specified disorders of breast: Secondary | ICD-10-CM

## 2021-07-29 DIAGNOSIS — R928 Other abnormal and inconclusive findings on diagnostic imaging of breast: Secondary | ICD-10-CM

## 2021-08-22 ENCOUNTER — Inpatient Hospital Stay: Admission: RE | Admit: 2021-08-22 | Payer: Managed Care, Other (non HMO) | Source: Ambulatory Visit

## 2021-08-22 ENCOUNTER — Other Ambulatory Visit: Payer: Managed Care, Other (non HMO)

## 2021-08-23 ENCOUNTER — Other Ambulatory Visit: Payer: Self-pay

## 2021-08-23 ENCOUNTER — Encounter: Payer: Self-pay | Admitting: Internal Medicine

## 2021-08-23 ENCOUNTER — Telehealth: Payer: Self-pay

## 2021-08-23 DIAGNOSIS — Z1211 Encounter for screening for malignant neoplasm of colon: Secondary | ICD-10-CM

## 2021-08-23 MED ORDER — NA SULFATE-K SULFATE-MG SULF 17.5-3.13-1.6 GM/177ML PO SOLN: 1.0000 | Freq: Once | ORAL | 0 refills | Status: AC

## 2021-08-23 NOTE — Telephone Encounter (Signed)
Gastroenterology Pre-Procedure Review  Request Date: 09/21 Requesting Physician: Dr. Tobi Bastos  PATIENT REVIEW QUESTIONS: The patient responded to the following health history questions as indicated:    1. Are you having any GI issues? no 2. Do you have a personal history of Polyps? no 3. Do you have a family history of Colon Cancer or Polyps? no 4. Diabetes Mellitus? no 5. Joint replacements in the past 12 months?no 6. Major health problems in the past 3 months?no 7. Any artificial heart valves, MVP, or defibrillator?no    MEDICATIONS & ALLERGIES:    Patient reports the following regarding taking any anticoagulation/antiplatelet therapy:   Plavix, Coumadin, Eliquis, Xarelto, Lovenox, Pradaxa, Brilinta, or Effient? no Aspirin? no  Patient confirms/reports the following medications:  Current Outpatient Medications  Medication Sig Dispense Refill   BLACK COHOSH PO Take by mouth.     Cats Claw, Uncaria tomentosa, (CATS CLAW PO) Take by mouth.     cholecalciferol (VITAMIN D) 1000 UNITS tablet Take 1,000 Units by mouth.     clindamycin (CLEOCIN T) 1 % lotion Apply topically 2 (two) times daily. 60 mL 2   Ferrous Sulfate (SLOW FE) 142 (45 Fe) MG TBCR Take by mouth.     hydrochlorothiazide (HYDRODIURIL) 25 MG tablet Take 25 mg by mouth daily.     loratadine (CLARITIN) 10 MG tablet Take 10 mg by mouth.     metoprolol succinate (TOPROL-XL) 25 MG 24 hr tablet Take 25 mg by mouth daily.      NON FORMULARY Bladderwrack 1200 mg     SPIRULINA PO Take by mouth.     triamcinolone cream (KENALOG) 0.5 % Apply topically.     No current facility-administered medications for this visit.    Patient confirms/reports the following allergies:  Allergies  Allergen Reactions   Zyrtec [Cetirizine] Shortness Of Breath   Milk-Related Compounds    Shellfish Allergy Itching   Fexofenadine Rash    Allegra   Rofecoxib Rash    Vioxx    No orders of the defined types were placed in this  encounter.   AUTHORIZATION INFORMATION Primary Insurance: 1D#: Group #:  Secondary Insurance: 1D#: Group #:  SCHEDULE INFORMATION: Date: 09/21 Time: Location: [facility]

## 2021-09-21 ENCOUNTER — Encounter: Payer: Self-pay | Admitting: Gastroenterology

## 2021-09-22 ENCOUNTER — Ambulatory Visit: Payer: Managed Care, Other (non HMO) | Admitting: Anesthesiology

## 2021-09-22 ENCOUNTER — Other Ambulatory Visit: Payer: Self-pay

## 2021-09-22 ENCOUNTER — Ambulatory Visit
Admission: RE | Admit: 2021-09-22 | Discharge: 2021-09-22 | Disposition: A | Discharge status: Home / Self Care | Payer: Managed Care, Other (non HMO) | Source: Ambulatory Visit | Attending: Gastroenterology | Admitting: Gastroenterology

## 2021-09-22 ENCOUNTER — Encounter
Admission: RE | Disposition: A | Discharge status: Home / Self Care | Payer: Self-pay | Source: Ambulatory Visit | Attending: Gastroenterology

## 2021-09-22 DIAGNOSIS — Z79899 Other long term (current) drug therapy: Secondary | ICD-10-CM | POA: Diagnosis not present

## 2021-09-22 DIAGNOSIS — I10 Essential (primary) hypertension: Secondary | ICD-10-CM | POA: Diagnosis not present

## 2021-09-22 DIAGNOSIS — Z1211 Encounter for screening for malignant neoplasm of colon: Secondary | ICD-10-CM

## 2021-09-22 DIAGNOSIS — Z87891 Personal history of nicotine dependence: Secondary | ICD-10-CM | POA: Insufficient documentation

## 2021-09-22 HISTORY — PX: COLONOSCOPY WITH PROPOFOL: SHX5780

## 2021-09-22 SURGERY — COLONOSCOPY WITH PROPOFOL
Anesthesia: General

## 2021-09-22 MED ORDER — LIDOCAINE HCL (CARDIAC) PF 100 MG/5ML IV SOSY
PREFILLED_SYRINGE | INTRAVENOUS | Status: DC | PRN
  Administered 2021-09-22: 100 mg via INTRAVENOUS

## 2021-09-22 MED ORDER — PROPOFOL 10 MG/ML IV BOLUS
INTRAVENOUS | Status: DC | PRN
  Administered 2021-09-22: 30 mg via INTRAVENOUS
  Administered 2021-09-22: 70 mg via INTRAVENOUS

## 2021-09-22 MED ORDER — PROPOFOL 500 MG/50ML IV EMUL
INTRAVENOUS | Status: DC | PRN
  Administered 2021-09-22: 165 ug/kg/min via INTRAVENOUS

## 2021-09-22 MED ORDER — SODIUM CHLORIDE 0.9 % IV SOLN: INTRAVENOUS | Status: DC

## 2021-09-22 NOTE — Anesthesia Preprocedure Evaluation (Signed)
Anesthesia Evaluation  Patient identified by MRN, date of birth, ID band Patient awake    Reviewed: Allergy & Precautions, NPO status , Patient's Chart, lab work & pertinent test results  Airway Mallampati: II  TM Distance: >3 FB Neck ROM: full    Dental  (+) Teeth Intact   Pulmonary neg pulmonary ROS, Patient abstained from smoking., former smoker,    Pulmonary exam normal        Cardiovascular Exercise Tolerance: Good hypertension, Pt. on medications negative cardio ROS Normal cardiovascular exam Rhythm:Regular     Neuro/Psych negative neurological ROS  negative psych ROS   GI/Hepatic negative GI ROS, Neg liver ROS,   Endo/Other  negative endocrine ROS  Renal/GU negative Renal ROS  negative genitourinary   Musculoskeletal   Abdominal Normal abdominal exam  (+)   Peds negative pediatric ROS (+)  Hematology negative hematology ROS (+)   Anesthesia Other Findings Past Medical History: No date: Hypertension  Past Surgical History: No date: BREAST CYST ASPIRATION; Left     Comment:  x 2 1980: EYE SURGERY; Left     Comment:  stye removal 2007: TUBAL LIGATION  BMI    Body Mass Index: 24.76 kg/m      Reproductive/Obstetrics negative OB ROS                             Anesthesia Physical Anesthesia Plan  ASA: 2  Anesthesia Plan: General   Post-op Pain Management:    Induction: Intravenous  PONV Risk Score and Plan: Propofol infusion and TIVA  Airway Management Planned: Natural Airway  Additional Equipment:   Intra-op Plan:   Post-operative Plan:   Informed Consent: I have reviewed the patients History and Physical, chart, labs and discussed the procedure including the risks, benefits and alternatives for the proposed anesthesia with the patient or authorized representative who has indicated his/her understanding and acceptance.     Dental Advisory Given  Plan  Discussed with: Anesthesiologist, CRNA and Surgeon  Anesthesia Plan Comments:         Anesthesia Quick Evaluation

## 2021-09-22 NOTE — H&P (Signed)
Jonathon Bellows, MD [address], [place], [place], [place], [address] [address], [place], [place], [place], [address] Phone: [phone]  Fax: [phone]  Primary Care Physician:  Casilda Carls, MD   Pre-Procedure History & Physical: HPI:  Sylvia Beasley is a 56 y.o. female is here for an colonoscopy.   Past Medical History:  Diagnosis Date   Hypertension     Past Surgical History:  Procedure Laterality Date   BREAST CYST ASPIRATION Left    x 2   EYE SURGERY Left 1980   stye removal   TUBAL LIGATION  2007    Prior to Admission medications   Medication Sig Start Date End Date Taking? Authorizing Provider  BLACK COHOSH PO Take by mouth.   Yes [provider]  Cats Claw, Uncaria tomentosa, (CATS CLAW PO) Take by mouth.   Yes [provider]  cholecalciferol (VITAMIN D) 1000 UNITS tablet Take 1,000 Units by mouth.   Yes [provider]  Ferrous Sulfate (SLOW FE) 142 (45 Fe) MG TBCR Take by mouth.   Yes [provider]  metoprolol succinate (TOPROL-XL) 25 MG 24 hr tablet Take 25 mg by mouth daily.  08/02/13  Yes [provider]  clindamycin (CLEOCIN T) 1 % lotion Apply topically 2 (two) times daily. 04/22/20   Ronny Bacon, MD  hydrochlorothiazide (HYDRODIURIL) 25 MG tablet Take 25 mg by mouth daily. Patient not taking: Reported on 09/22/2021    [provider]  loratadine (CLARITIN) 10 MG tablet Take 10 mg by mouth.    [provider]  NON FORMULARY Bladderwrack 1200 mg Patient not taking: Reported on 09/22/2021    [provider]  SPIRULINA PO Take by mouth. Patient not taking: Reported on 09/22/2021    [provider]  triamcinolone cream (KENALOG) 0.5 % Apply topically. 04/12/11   [provider]    Allergies as of 08/24/2021 - Review Complete 08/23/2021  Allergen Reaction Noted   Zyrtec [cetirizine] Shortness Of Breath 10/08/2013   Milk-related compounds  11/20/2016    Shellfish allergy Itching 10/08/2013   Fexofenadine Rash 04/10/2011   Rofecoxib Rash 04/10/2011    Family History  Problem Relation Age of Onset   Breast cancer Sister 68    Social History   Socioeconomic History   Marital status: Married    Spouse name: Not on file   Number of children: Not on file   Years of education: Not on file   Highest education level: Not on file  Occupational History   Not on file  Tobacco Use   Smoking status: Former    Types: Cigarettes    Quit date: 01/03/1988    Years since quitting: 33.7   Smokeless tobacco: Never  Vaping Use   Vaping Use: Never used  Substance and Sexual Activity   Alcohol use: No   Drug use: No   Sexual activity: Not on file  Other Topics Concern   Not on file  Social History Narrative   Not on file   Social Determinants of Health   Financial Resource Strain: Not on file  Food Insecurity: Not on file  Transportation Needs: Not on file  Physical Activity: Not on file  Stress: Not on file  Social Connections: Not on file  Intimate Partner Violence: Not on file    Review of Systems: See HPI, otherwise negative ROS  Physical Exam: BP (!) 177/96   Pulse 67   Temp (!) 96.5 F (35.8 C) (Temporal)  Resp 18   Ht 5' 3.5" (1.613 m)   Wt 64.4 kg   LMP 05/22/2015   SpO2 100%   BMI 24.76 kg/m  General:   Alert,  pleasant and cooperative in NAD Head:  Normocephalic and atraumatic. Neck:  Supple; no masses or thyromegaly. Lungs:  Clear throughout to auscultation, normal respiratory effort.    Heart:  +S1, +S2, Regular rate and rhythm, No edema. Abdomen:  Soft, nontender and nondistended. Normal bowel sounds, without guarding, and without rebound.   Neurologic:  Alert and  oriented x4;  grossly normal neurologically.  Impression/Plan: Sylvia Beasley is here for an colonoscopy to be performed for Screening colonoscopy average risk   Risks, benefits, limitations, and alternatives regarding  colonoscopy have been  reviewed with the patient.  Questions have been answered.  All parties agreeable.   Jonathon Bellows, MD  09/22/2021, 8:38 AM

## 2021-09-22 NOTE — Anesthesia Postprocedure Evaluation (Signed)
Anesthesia Post Note  Patient: Sylvia Beasley  Procedure(s) Performed: COLONOSCOPY WITH PROPOFOL  Patient location during evaluation: PACU Anesthesia Type: General Level of consciousness: awake and oriented Pain management: satisfactory to patient Vital Signs Assessment: post-procedure vital signs reviewed and stable Respiratory status: spontaneous breathing and respiratory function stable Cardiovascular status: stable Anesthetic complications: no   No notable events documented.   Last Vitals:  Vitals:   09/22/21 0912 09/22/21 0922  BP: (!) 151/97 (!) 170/81  Pulse: 65 65  Resp: 13 12  Temp:    SpO2: 93% 100%    Last Pain:  Vitals:   09/22/21 0922  TempSrc:   PainSc: 0-No pain                 VAN STAVEREN,Caster [place]

## 2021-09-22 NOTE — Anesthesia Procedure Notes (Signed)
Procedure Name: General with mask airway Date/Time: 09/22/2021 8:52 AM  Performed by: Kelton Pillar, CRNAPre-anesthesia Checklist: Patient identified, Emergency Drugs available, Suction available and Patient being monitored Patient Re-evaluated:Patient Re-evaluated prior to induction Oxygen Delivery Method: Simple face mask Induction Type: IV induction Placement Confirmation: positive ETCO2, CO2 detector and breath sounds checked- equal and bilateral Dental Injury: Teeth and Oropharynx as per pre-operative assessment

## 2021-09-22 NOTE — Op Note (Signed)
[facility] Gastroenterology Patient Name: Sylvia Beasley Procedure Date: 09/22/2021 8:36 AM MRN: [id number] Account #: [id number] Date of Birth: 1965-07-05 Admit Type: Outpatient Age: 56 Room: [facility] ENDO ROOM 3 Gender: Female Note Status: Finalized Instrument Name: Jasper Riling [id number] Procedure:             Colonoscopy Indications:           Screening for colorectal malignant neoplasm Providers:             Jonathon Bellows MD, MD Referring MD:          Casilda Carls, MD (Referring MD) Medicines:             Monitored Anesthesia Care Complications:         No immediate complications. Procedure:             Pre-Anesthesia Assessment:                        - Prior to the procedure, a History and Physical was                         performed, and patient medications, allergies and                         sensitivities were reviewed. The patient's tolerance                         of previous anesthesia was reviewed.                        - The risks and benefits of the procedure and the                         sedation options and risks were discussed with the                         patient. All questions were answered and informed                         consent was obtained.                        - ASA Grade Assessment: II - A patient with mild                         systemic disease.                        After obtaining informed consent, the colonoscope was                         passed under direct vision. Throughout the procedure,                         the patient's blood pressure, pulse, and oxygen                         saturations were monitored continuously. The                         Colonoscope was introduced  through the anus and                         advanced to the the cecum, identified by the                         appendiceal orifice. The colonoscopy was performed                         without difficulty. The patient tolerated the                          procedure well. The quality of the bowel preparation                         was excellent. Findings:      The perianal and digital rectal examinations were normal.      The entire examined colon appeared normal. Impression:            - The entire examined colon is normal.                        - No specimens collected. Recommendation:        - Discharge patient to home (with escort).                        - Resume previous diet.                        - Continue present medications.                        - Repeat colonoscopy in 10 years for screening                         purposes. Procedure Code(s):     --- Professional ---                        [phone], Colonoscopy, flexible; diagnostic, including                         collection of specimen(s) by brushing or washing, when                         performed (separate procedure) Diagnosis Code(s):     --- Professional ---                        Z12.11, Encounter for screening for malignant neoplasm                         of colon CPT copyright 2019 American Medical Association. All rights reserved. The codes documented in this report are preliminary and upon coder review may  be revised to meet current compliance requirements. Jonathon Bellows, MD Jonathon Bellows MD, MD 09/22/2021 8:59:57 AM This report has been signed electronically. Number of Addenda: 0 Note Initiated On: 09/22/2021 8:36 AM Scope Withdrawal Time: 0 hours 7 minutes 4 seconds  Total Procedure Duration: 0 hours 11 minutes 53 seconds  Estimated Blood Loss:  Estimated blood loss: none.  [facility]

## 2021-09-22 NOTE — Transfer of Care (Signed)
Immediate Anesthesia Transfer of Care Note  Patient: Sylvia Beasley  Procedure(s) Performed: COLONOSCOPY WITH PROPOFOL  Patient Location: Endoscopy Unit  Anesthesia Type:General  Level of Consciousness: drowsy and patient cooperative  Airway & Oxygen Therapy: Patient Spontanous Breathing and Patient connected to face mask oxygen  Post-op Assessment: Report given to RN and Post -op Vital signs reviewed and stable  Post vital signs: Reviewed and stable  Last Vitals:  Vitals Value Taken Time  BP 126/80 09/22/21 0903  Temp 36.4 C 09/22/21 0903  Pulse 70 09/22/21 0904  Resp 17 09/22/21 0906  SpO2 100 % 09/22/21 0904  Vitals shown include unvalidated device data.  Last Pain:  Vitals:   09/22/21 0903  TempSrc: Temporal  PainSc: Asleep         Complications: No notable events documented.

## 2021-09-23 ENCOUNTER — Encounter: Payer: Self-pay | Admitting: Gastroenterology

## 2022-03-03 LAB — CBC AND DIFFERENTIAL
HCT: 42 (ref 36–46)
Hemoglobin: 13.6 (ref 12.0–16.0)
Neutrophils Absolute: 1.2
Platelets: 334 10*3/uL (ref 150–400)
WBC: 2.5

## 2022-03-03 LAB — HEPATIC FUNCTION PANEL
ALT: 31 U/L (ref 7–35)
AST: 49 — AB (ref 13–35)
Alkaline Phosphatase: 87 (ref 25–125)
Bilirubin, Direct: 0.14
Bilirubin, Total: 0.6

## 2022-03-03 LAB — BASIC METABOLIC PANEL
BUN: 9 (ref 4–21)
CO2: 22 (ref 13–22)
Chloride: 97 — AB (ref 99–108)
Creatinine: 1 (ref 0.5–1.1)
Glucose: 89
Potassium: 4.3 mEq/L (ref 3.5–5.1)
Sodium: 138 (ref 137–147)

## 2022-03-03 LAB — LIPID PANEL
Cholesterol: 234 — AB (ref 0–200)
HDL: 70 (ref 35–70)
LDL Cholesterol: 151
LDl/HDL Ratio: 2.2
Triglycerides: 76 (ref 40–160)

## 2022-03-03 LAB — VITAMIN D 25 HYDROXY (VIT D DEFICIENCY, FRACTURES): Vit D, 25-Hydroxy: 37.8

## 2022-03-03 LAB — COMPREHENSIVE METABOLIC PANEL
Albumin: 5 (ref 3.5–5.0)
Calcium: 9.9 (ref 8.7–10.7)
eGFR: 64

## 2022-03-03 LAB — HEMOGLOBIN A1C: Hemoglobin A1C: 5.9

## 2022-03-03 LAB — CBC: RBC: 5.19 — AB (ref 3.87–5.11)

## 2022-03-03 LAB — HM HEPATITIS C SCREENING LAB: HM Hepatitis Screen: NEGATIVE

## 2022-03-03 LAB — HEPATITIS B SURFACE ANTIGEN: Hepatitis B Surface Ag: REACTIVE

## 2022-05-17 NOTE — Progress Notes (Unsigned)
I,Jarett Dralle S Trey Bebee,acting as a Neurosurgeon for Shirlee Latch, MD.,have documented all relevant documentation on the behalf of Shirlee Latch, MD,as directed by  Shirlee Latch, MD while in the presence of Shirlee Latch, MD.   New patient visit   Patient: Sylvia Beasley   DOB: [birth year]   57 y.o. Female  MRN: [id number] Visit Date: 05/18/2022  Today's healthcare provider: Shirlee Latch, MD   No chief complaint on file.  Subjective    Madline Sliwa is a 57 y.o. female who presents today as a new patient to establish care.  HPI  ***  Past Medical History:  Diagnosis Date   Hypertension    Past Surgical History:  Procedure Laterality Date   BREAST CYST ASPIRATION Left    x 2   COLONOSCOPY WITH PROPOFOL N/A 09/22/2021   Procedure: COLONOSCOPY WITH PROPOFOL;  Surgeon: Wyline Mood, MD;  Location: [facility] ENDOSCOPY;  Service: Gastroenterology;  Laterality: N/A;   EYE SURGERY Left 1980   stye removal   TUBAL LIGATION  2007   Family Status  Relation Name Status   Sister  (Not Specified)   Family History  Problem Relation Age of Onset   Breast cancer Sister 29   Social History   Socioeconomic History   Marital status: Married    Spouse name: Not on file   Number of children: Not on file   Years of education: Not on file   Highest education level: Not on file  Occupational History   Not on file  Tobacco Use   Smoking status: Former    Types: Cigarettes    Quit date: 01/03/1988    Years since quitting: 34.3   Smokeless tobacco: Never  Vaping Use   Vaping Use: Never used  Substance and Sexual Activity   Alcohol use: No   Drug use: No   Sexual activity: Not on file  Other Topics Concern   Not on file  Social History Narrative   Not on file   Social Determinants of Health   Financial Resource Strain: Not on file  Food Insecurity: Not on file  Transportation Needs: Not on file  Physical Activity: Not on file  Stress: Not on file  Social Connections: Not  on file   Outpatient Medications Prior to Visit  Medication Sig   BLACK COHOSH PO Take by mouth.   Cats Claw, Uncaria tomentosa, (CATS CLAW PO) Take by mouth.   cholecalciferol (VITAMIN D) 1000 UNITS tablet Take 1,000 Units by mouth.   clindamycin (CLEOCIN T) 1 % lotion Apply topically 2 (two) times daily.   Ferrous Sulfate (SLOW FE) 142 (45 Fe) MG TBCR Take by mouth.   hydrochlorothiazide (HYDRODIURIL) 25 MG tablet Take 25 mg by mouth daily. (Patient not taking: Reported on 09/22/2021)   loratadine (CLARITIN) 10 MG tablet Take 10 mg by mouth.   metoprolol succinate (TOPROL-XL) 25 MG 24 hr tablet Take 25 mg by mouth daily.    NON FORMULARY Bladderwrack 1200 mg (Patient not taking: Reported on 09/22/2021)   SPIRULINA PO Take by mouth. (Patient not taking: Reported on 09/22/2021)   triamcinolone cream (KENALOG) 0.5 % Apply topically.   No facility-administered medications prior to visit.   Allergies  Allergen Reactions   Zyrtec [Cetirizine] Shortness Of Breath   Milk-Related Compounds    Shellfish Allergy Itching   Fexofenadine Rash    Allegra   Rofecoxib Rash    Vioxx     There is no immunization history on file for this patient.  Health  Maintenance  Topic Date Due   COVID-19 Vaccine (1) Never done   HIV Screening  Never done   Hepatitis C Screening  Never done   DTaP/Tdap/Td (1 - Tdap) Never done   PAP SMEAR-Modifier  Never done   Zoster Vaccines- Shingrix (1 of 2) Never done   INFLUENZA VACCINE  08/03/2022   MAMMOGRAM  07/28/2023   COLONOSCOPY (Pts 45-83yrs Insurance coverage will need to be confirmed)  09/23/2031   HPV VACCINES  Aged Out    Patient Care Team: Sherrie Mustache, MD as PCP - General (Internal Medicine) Sherrie Mustache, MD (Internal Medicine) Kieth Brightly, MD (General Surgery)  Review of Systems  {Labs  Heme  Chem  Endocrine  Serology  Results Review (optional):23779}   Objective    LMP 05/22/2015  {Show previous vital signs  (optional):23777}  Physical Exam ***  Depression Screen     No data to display         No results found for any visits on 05/18/22.  Assessment & Plan     ***  No follow-ups on file.     {provider attestation***:1}   Shirlee Latch, MD  [facility] [phone] (phone) [phone] (fax)  [facility]

## 2022-05-18 ENCOUNTER — Ambulatory Visit (INDEPENDENT_AMBULATORY_CARE_PROVIDER_SITE_OTHER): Payer: Managed Care, Other (non HMO) | Admitting: Family Medicine

## 2022-05-18 ENCOUNTER — Encounter: Payer: Self-pay | Admitting: Family Medicine

## 2022-05-18 VITALS — BP 120/76 | HR 74 | Temp 97.7°F | Resp 16 | Ht 63.5 in | Wt 148.4 lb

## 2022-05-18 DIAGNOSIS — I10 Essential (primary) hypertension: Secondary | ICD-10-CM

## 2022-05-18 DIAGNOSIS — E782 Mixed hyperlipidemia: Secondary | ICD-10-CM

## 2022-05-18 DIAGNOSIS — R7303 Prediabetes: Secondary | ICD-10-CM

## 2022-05-18 DIAGNOSIS — N951 Menopausal and female climacteric states: Secondary | ICD-10-CM | POA: Diagnosis not present

## 2022-05-18 DIAGNOSIS — N952 Postmenopausal atrophic vaginitis: Secondary | ICD-10-CM | POA: Diagnosis not present

## 2022-05-18 DIAGNOSIS — R7989 Other specified abnormal findings of blood chemistry: Secondary | ICD-10-CM | POA: Insufficient documentation

## 2022-05-18 DIAGNOSIS — L732 Hidradenitis suppurativa: Secondary | ICD-10-CM

## 2022-05-18 MED ORDER — LOSARTAN POTASSIUM 50 MG PO TABS: 50.0000 mg | ORAL_TABLET | Freq: Every day | ORAL | 1 refills | Status: DC

## 2022-05-18 MED ORDER — GABAPENTIN 300 MG PO CAPS: 300.0000 mg | ORAL_CAPSULE | Freq: Every day | ORAL | 1 refills | Status: DC

## 2022-05-18 MED ORDER — ESTRADIOL 0.1 MG/GM VA CREA
1.0000 | TOPICAL_CREAM | VAGINAL | 12 refills | Status: DC
Start: 2022-05-19 — End: 2023-01-04

## 2022-05-18 MED ORDER — METOPROLOL SUCCINATE ER 25 MG PO TB24: 25.0000 mg | ORAL_TABLET | Freq: Every day | ORAL | 1 refills | Status: DC

## 2022-05-18 NOTE — Assessment & Plan Note (Signed)
ROI sent to review last lipid panel Not currently on a statin Recheck FLP and CMP at next visit Discussed diet and exercise

## 2022-05-18 NOTE — Assessment & Plan Note (Signed)
Well controlled Continue current medications Recheck metabolic panel at next visit ROI sent for last metabolic panel F/u in 3 months

## 2022-05-18 NOTE — Assessment & Plan Note (Signed)
Recommend low carb diet Recheck A1c at next visit ROI sent for last A1c

## 2022-05-18 NOTE — Assessment & Plan Note (Signed)
Ongoing issue Has never tried treatment Trial of Estrace cream

## 2022-05-18 NOTE — Assessment & Plan Note (Signed)
Longstanding issue Chlorhexidine wash as needed Does not follow with dermatology Finds that if she controls her diet she does not have as much of an issue with it Discussed return precautions

## 2022-05-18 NOTE — Assessment & Plan Note (Signed)
Per patient mild elevation noted on last labs ROI sent to review last labs Will repeat at next visit or sooner pending results

## 2022-05-18 NOTE — Assessment & Plan Note (Signed)
Longstanding issue Has tried OTC/herbal remedies without success Discussed HRT, but would like to avoid at this time Trial of gabapentin 300 mg nightly Can consider higher dose in the future if needed Could also consider SSRI/SNRI

## 2022-05-31 ENCOUNTER — Encounter: Payer: Self-pay | Admitting: Family Medicine

## 2022-06-28 ENCOUNTER — Other Ambulatory Visit: Payer: Self-pay | Admitting: *Deleted

## 2022-06-28 ENCOUNTER — Inpatient Hospital Stay
Admission: RE | Admit: 2022-06-28 | Discharge: 2022-06-28 | Disposition: A | Discharge status: Home / Self Care | Payer: Self-pay | Source: Ambulatory Visit | Attending: Family Medicine | Admitting: Family Medicine

## 2022-06-28 DIAGNOSIS — Z1231 Encounter for screening mammogram for malignant neoplasm of breast: Secondary | ICD-10-CM

## 2022-06-29 ENCOUNTER — Other Ambulatory Visit: Payer: Self-pay | Admitting: Family Medicine

## 2022-06-29 DIAGNOSIS — Z1231 Encounter for screening mammogram for malignant neoplasm of breast: Secondary | ICD-10-CM

## 2022-08-02 ENCOUNTER — Ambulatory Visit
Admission: RE | Admit: 2022-08-02 | Discharge: 2022-08-02 | Disposition: A | Discharge status: Home / Self Care | Payer: Managed Care, Other (non HMO) | Source: Ambulatory Visit | Attending: Family Medicine | Admitting: Family Medicine

## 2022-08-02 DIAGNOSIS — Z1231 Encounter for screening mammogram for malignant neoplasm of breast: Secondary | ICD-10-CM

## 2022-08-18 ENCOUNTER — Encounter: Payer: Self-pay | Admitting: Family Medicine

## 2022-08-18 ENCOUNTER — Ambulatory Visit (INDEPENDENT_AMBULATORY_CARE_PROVIDER_SITE_OTHER): Payer: Managed Care, Other (non HMO) | Admitting: Family Medicine

## 2022-08-18 VITALS — BP 143/78 | HR 66 | Temp 98.3°F | Resp 20 | Ht 63.5 in | Wt 151.5 lb

## 2022-08-18 DIAGNOSIS — Z23 Encounter for immunization: Secondary | ICD-10-CM | POA: Diagnosis not present

## 2022-08-18 DIAGNOSIS — R7303 Prediabetes: Secondary | ICD-10-CM | POA: Diagnosis not present

## 2022-08-18 DIAGNOSIS — D708 Other neutropenia: Secondary | ICD-10-CM | POA: Insufficient documentation

## 2022-08-18 DIAGNOSIS — I10 Essential (primary) hypertension: Secondary | ICD-10-CM | POA: Diagnosis not present

## 2022-08-18 DIAGNOSIS — Z114 Encounter for screening for human immunodeficiency virus [HIV]: Secondary | ICD-10-CM

## 2022-08-18 DIAGNOSIS — E782 Mixed hyperlipidemia: Secondary | ICD-10-CM | POA: Diagnosis not present

## 2022-08-18 DIAGNOSIS — Z0000 Encounter for general adult medical examination without abnormal findings: Secondary | ICD-10-CM | POA: Diagnosis not present

## 2022-08-18 NOTE — Assessment & Plan Note (Signed)
Reviewed last lipid panel Not currently on a statin Recheck FLP and CMP Discussed diet and exercise  

## 2022-08-18 NOTE — Progress Notes (Signed)
Complete physical exam  Patient: Sylvia Beasley   DOB: 06-Apr-1965   57 y.o. Female  MRN: [id number]  Subjective:    Chief Complaint  Patient presents with   Annual Exam    Patient reports feeling well. She reports good compliance and tolerance to medications.     Sylvia Beasley is a 57 y.o. female who presents today for a complete physical exam. She reports consuming a general diet.  She generally feels well. She reports sleeping well. She does not have additional problems to discuss today.   Discussed the use of AI scribe software for clinical note transcription with the patient, who gave verbal consent to proceed.  History of Present Illness   The patient presents for an annual physical. She reports no new health concerns and is generally feeling well.  She had a colonoscopy last year and a mammogram a few weeks ago, both of which were normal. She has completed their shingles vaccination series.  The patient's blood pressure was slightly elevated at the start of the visit, which she attributes to eating red meat the previous night. She monitors her blood pressure at home a couple of times a week. She is currently taking losartan 50mg  and metoprolol 25mg  daily for blood pressure management.  The patient has been experiencing itching in her ears, which she has been managing with olive oil. This has been effective in calming the itching.       Most recent fall risk assessment:    08/18/2022   11:11 AM  Fall Risk   Falls in the past year? 0  Number falls in past yr: 0  Injury with Fall? 0  Risk for fall due to : No Fall Risks  Follow up Falls evaluation completed     Most recent depression screenings:    08/18/2022   11:11 AM 05/18/2022    9:45 AM  PHQ 2/9 Scores  PHQ - 2 Score 0 0        Patient Care Team: Erasmo Downer, MD as PCP - General (Family Medicine) Sherrie Mustache, MD (Internal Medicine) Kieth Brightly, MD (General Surgery)   Outpatient  Medications Prior to Visit  Medication Sig   BLACK COHOSH PO Take by mouth.   Cats Claw, Uncaria tomentosa, (CATS CLAW PO) Take by mouth.   cholecalciferol (VITAMIN D) 1000 UNITS tablet Take 1,000 Units by mouth.   estradiol (ESTRACE VAGINAL) 0.1 MG/GM vaginal cream Place 1 Applicatorful vaginally 3 (three) times a week.   Ferrous Sulfate (SLOW FE) 142 (45 Fe) MG TBCR Take by mouth.   gabapentin (NEURONTIN) 300 MG capsule Take 1 capsule (300 mg total) by mouth at bedtime.   loratadine (CLARITIN) 10 MG tablet Take 10 mg by mouth.   losartan (COZAAR) 50 MG tablet Take 1 tablet (50 mg total) by mouth daily.   metoprolol succinate (TOPROL-XL) 25 MG 24 hr tablet Take 1 tablet (25 mg total) by mouth daily.   tretinoin (RETIN-A) 0.1 % cream Apply topically at bedtime.   triamcinolone cream (KENALOG) 0.5 % Apply topically.   No facility-administered medications prior to visit.    ROS per HPI     Objective:     BP (!) 143/78 (BP Location: Right Arm, Patient Position: Sitting, Cuff Size: Normal)   Pulse 66   Temp 98.3 F (36.8 C) (Temporal)   Resp 20   Ht 5' 3.5" (1.613 m)   Wt 151 lb 8 oz (68.7 kg)   LMP 05/22/2015   BMI  26.42 kg/m    Physical Exam Vitals reviewed.  Constitutional:      General: She is not in acute distress.    Appearance: Normal appearance. She is well-developed. She is not diaphoretic.  HENT:     Head: Normocephalic and atraumatic.     Right Ear: Tympanic membrane, ear canal and external ear normal.     Left Ear: Tympanic membrane, ear canal and external ear normal.     Nose: Nose normal.     Mouth/Throat:     Mouth: Mucous membranes are moist.     Pharynx: Oropharynx is clear. No oropharyngeal exudate.  Eyes:     General: No scleral icterus.    Conjunctiva/sclera: Conjunctivae normal.     Pupils: Pupils are equal, round, and reactive to light.  Neck:     Thyroid: No thyromegaly.  Cardiovascular:     Rate and Rhythm: Normal rate and regular rhythm.      Heart sounds: Normal heart sounds. No murmur heard. Pulmonary:     Effort: Pulmonary effort is normal. No respiratory distress.     Breath sounds: Normal breath sounds. No wheezing or rales.  Abdominal:     General: There is no distension.     Palpations: Abdomen is soft.     Tenderness: There is no abdominal tenderness.  Musculoskeletal:        General: No deformity.     Cervical back: Neck supple.     Right lower leg: No edema.     Left lower leg: No edema.  Lymphadenopathy:     Cervical: No cervical adenopathy.  Skin:    General: Skin is warm and dry.     Findings: No rash.  Neurological:     Mental Status: She is alert and oriented to person, place, and time. Mental status is at baseline.     Gait: Gait normal.  Psychiatric:        Mood and Affect: Mood normal.        Behavior: Behavior normal.        Thought Content: Thought content normal.      Results for orders placed or performed in visit on 08/18/22  HM PAP SMEAR  Result Value Ref Range   HM Pap smear negative        Assessment & Plan:    Routine Health Maintenance and Physical Exam  Immunization History  Administered Date(s) Administered   COVID-19, mRNA, vaccine(Comirnaty)12 years and older 10/18/2021   Moderna Sars-Covid-2 Vaccination 03/20/2019, 04/17/2019   PFIZER(Purple Top)SARS-COV-2 Vaccination 11/02/2019, 12/01/2020   Td 06/19/2001   Zoster Recombinant(Shingrix) 04/02/2020, 06/02/2020    Health Maintenance  Topic Date Due   HIV Screening  Never done   DTaP/Tdap/Td (2 - Tdap) 06/20/2011   COVID-19 Vaccine (6 - 2023-24 season) 12/13/2021   INFLUENZA VACCINE  04/02/2023 (Originally 08/03/2022)   PAP SMEAR-Modifier  04/02/2023   MAMMOGRAM  08/01/2024   Colonoscopy  09/23/2031   Hepatitis C Screening  Completed   Zoster Vaccines- Shingrix  Completed   HPV VACCINES  Aged Out    Discussed health benefits of physical activity, and encouraged her to engage in regular exercise appropriate for her  age and condition.  Problem List Items Addressed This Visit       Cardiovascular and Mediastinum   Primary hypertension   Relevant Orders   Comprehensive metabolic panel     Other   Moderate mixed hyperlipidemia not requiring statin therapy    Reviewed last lipid panel Not currently  on a statin Recheck FLP and CMP Discussed diet and exercise       Relevant Orders   Comprehensive metabolic panel   Lipid panel   Prediabetes    Recommend low carb diet Recheck A1c       Relevant Orders   Hemoglobin A1c   Other neutropenia (HCC)    Recheck CBC      Relevant Orders   CBC w/Diff/Platelet   Other Visit Diagnoses     Encounter for annual physical exam    -  Primary   Relevant Orders   Comprehensive metabolic panel   Lipid panel   Hemoglobin A1c   CBC w/Diff/Platelet   HIV antibody (with reflex)   Encounter for screening for HIV       Relevant Orders   HIV antibody (with reflex)        Hypertension Elevated blood pressure at today's visit. Patient reports home monitoring and occasional dietary indiscretions. Currently on Losartan 50mg  and Metoprolol 25mg  daily. -Recheck blood pressure at end of visit. -Encourage continued home monitoring and adherence to low sodium diet. -Plan for follow-up in 6 months.  Ear Dermatitis Reports itching and use of olive oil for relief. -Continue use of olive oil as needed for symptom relief.  General Health Maintenance -Defer Pap smear until 2025 as per guidelines. -Recent colonoscopy and mammogram, no further action needed. -Completed shingles vaccination series. -Plan for flu shot and COVID booster when available in the next month. -Update Tetanus shot today. -Order labs including CBC, kidney and liver function, cholesterol, and A1c. Patient to return fasting for labs. -Add HIV screening to lab order as per guidelines. -Follow-up in 6 months for blood pressure check.        Return in about 6 months (around 02/18/2023)  for chronic disease f/u.     Shirlee Latch, MD

## 2022-08-18 NOTE — Assessment & Plan Note (Signed)
Recheck CBC. 

## 2022-08-18 NOTE — Assessment & Plan Note (Signed)
Recommend low carb diet °Recheck A1c  °

## 2022-08-18 NOTE — Addendum Note (Signed)
Addended by: Hyacinth Meeker on: 08/18/2022 11:48 AM   Modules accepted: Orders

## 2022-08-22 LAB — CBC WITH DIFFERENTIAL/PLATELET
Basophils Absolute: 0 10*3/uL (ref 0.0–0.2)
Basos: 1 %
EOS (ABSOLUTE): 0.2 10*3/uL (ref 0.0–0.4)
Eos: 6 %
Hematocrit: 39.8 % (ref 34.0–46.6)
Hemoglobin: 12.6 g/dL (ref 11.1–15.9)
Immature Grans (Abs): 0 10*3/uL (ref 0.0–0.1)
Immature Granulocytes: 0 %
Lymphocytes Absolute: 1.2 10*3/uL (ref 0.7–3.1)
Lymphs: 33 %
MCH: 26.4 pg — ABNORMAL LOW (ref 26.6–33.0)
MCHC: 31.7 g/dL (ref 31.5–35.7)
MCV: 83 fL (ref 79–97)
Monocytes Absolute: 0.3 10*3/uL (ref 0.1–0.9)
Monocytes: 9 %
Neutrophils Absolute: 1.9 10*3/uL (ref 1.4–7.0)
Neutrophils: 51 %
Platelets: 319 10*3/uL (ref 150–450)
RBC: 4.78 x10E6/uL (ref 3.77–5.28)
RDW: 13.3 % (ref 11.7–15.4)
WBC: 3.7 10*3/uL (ref 3.4–10.8)

## 2022-08-22 LAB — COMPREHENSIVE METABOLIC PANEL
ALT: 16 IU/L (ref 0–32)
AST: 19 IU/L (ref 0–40)
Albumin: 4.5 g/dL (ref 3.8–4.9)
Alkaline Phosphatase: 102 IU/L (ref 44–121)
BUN/Creatinine Ratio: 11 (ref 9–23)
BUN: 11 mg/dL (ref 6–24)
Bilirubin Total: 0.5 mg/dL (ref 0.0–1.2)
CO2: 26 mmol/L (ref 20–29)
Calcium: 9.3 mg/dL (ref 8.7–10.2)
Chloride: 101 mmol/L (ref 96–106)
Creatinine, Ser: 0.97 mg/dL (ref 0.57–1.00)
Globulin, Total: 2.7 g/dL (ref 1.5–4.5)
Glucose: 92 mg/dL (ref 70–99)
Potassium: 4.1 mmol/L (ref 3.5–5.2)
Sodium: 140 mmol/L (ref 134–144)
Total Protein: 7.2 g/dL (ref 6.0–8.5)
eGFR: 67 mL/min/{1.73_m2} (ref >59)

## 2022-08-22 LAB — LIPID PANEL
Chol/HDL Ratio: 3 ratio (ref 0.0–4.4)
Cholesterol, Total: 212 mg/dL — ABNORMAL HIGH (ref 100–199)
HDL: 70 mg/dL (ref >39)
LDL Chol Calc (NIH): 119 mg/dL — ABNORMAL HIGH (ref 0–99)
Triglycerides: 132 mg/dL (ref 0–149)
VLDL Cholesterol Cal: 23 mg/dL (ref 5–40)

## 2022-08-22 LAB — HEMOGLOBIN A1C
Est. average glucose Bld gHb Est-mCnc: 128 mg/dL
Hgb A1c MFr Bld: 6.1 % — ABNORMAL HIGH (ref 4.8–5.6)

## 2022-08-22 LAB — HIV ANTIBODY (ROUTINE TESTING W REFLEX): HIV Screen 4th Generation wRfx: NONREACTIVE

## 2022-10-14 ENCOUNTER — Other Ambulatory Visit: Payer: Self-pay | Admitting: Family Medicine

## 2022-10-16 NOTE — Telephone Encounter (Signed)
Requested medications are due for refill today.  unsure  Requested medications are on the active medications list.  yes  Last refill. 05/18/2022  Future visit scheduled.   yes  Notes to clinic.  Medication is historical.    Requested Prescriptions  Pending Prescriptions Disp Refills   tretinoin (RETIN-A) 0.1 % cream [Pharmacy Med Name: TRETINOIN 0.1% CREAM] 45 g 6    Sig: APPLY A PEA SIZED AMOUNT TOPICALLY NIGHTLY.     Dermatology:  Acne preparations Passed - 10/14/2022  9:48 AM      Passed - Valid encounter within last 12 months    Recent Outpatient Visits           1 month ago Encounter for annual physical exam   [facility] [place], Marzella Schlein, MD   5 months ago Primary hypertension   [place] [facility] [place], Marzella Schlein, MD       Future Appointments             In 4 months Bacigalupo, Marzella Schlein, MD [facility]

## 2022-10-19 ENCOUNTER — Other Ambulatory Visit: Payer: Self-pay | Admitting: Family Medicine

## 2022-10-19 NOTE — Telephone Encounter (Signed)
Requested medications are due for refill today.  unsure  Requested medications are on the active medications list.  yes  Last refill. 04/11/2021  Future visit scheduled.   yes  Notes to clinic.  Refill not delegated. Medication is historical.    Requested Prescriptions  Pending Prescriptions Disp Refills   triamcinolone cream (KENALOG) 0.5 % 30 g     Sig: Apply topically.     Not Delegated - Dermatology:  Corticosteroids Failed - 10/19/2022  1:12 PM      Failed - This refill cannot be delegated      Passed - Valid encounter within last 12 months    Recent Outpatient Visits           2 months ago Encounter for annual physical exam   [facility] Beas[place], Sylvia Schlein, MD   5 months ago Primary hypertension    [facility] [place], Sylvia Schlein, MD       Future Appointments             In 4 months Beasley, Sylvia Schlein, MD [facility]

## 2022-10-19 NOTE — Telephone Encounter (Signed)
Medication Refill - Medication: triamcinolone cream (KENALOG) 0.5 %   Has the patient contacted their pharmacy? Yes.    Preferred Pharmacy (with phone number or street name):  [facility] [phone] [place], [place] [address] Phone: [phone]  Fax: [phone]     Has the patient been seen for an appointment in the last year OR does the patient have an upcoming appointment? Yes.    Agent: Please be advised that RX refills may take up to 3 business days. We ask that you follow-up with your pharmacy.

## 2022-10-19 NOTE — Telephone Encounter (Signed)
Duplicate request  Requested Prescriptions  Pending Prescriptions Disp Refills   tretinoin (RETIN-A) 0.1 % cream [Pharmacy Med Name: TRETINOIN 0.1% CREAM] 45 g 6    Sig: APPLY A PEA SIZED AMOUNT TOPICALLY NIGHTLY.     Dermatology:  Acne preparations Passed - 10/19/2022 11:23 AM      Passed - Valid encounter within last 12 months    Recent Outpatient Visits           2 months ago Encounter for annual physical exam   [place] [facility] [place], Marzella Schlein, MD   5 months ago Primary hypertension   [facility] [place], Marzella Schlein, MD       Future Appointments             In 4 months Bacigalupo, Marzella Schlein, MD [facility]

## 2022-10-20 ENCOUNTER — Other Ambulatory Visit: Payer: Self-pay | Admitting: Family Medicine

## 2022-10-20 MED ORDER — TRETINOIN 0.1 % EX CREA: TOPICAL_CREAM | Freq: Every day | CUTANEOUS | 6 refills | Status: DC

## 2022-10-20 NOTE — Telephone Encounter (Signed)
[facility]

## 2022-10-20 NOTE — Telephone Encounter (Signed)
Listed as transmission failed- will resend Requested Prescriptions  Pending Prescriptions Disp Refills   tretinoin (RETIN-A) 0.1 % cream 45 g 6    Sig: Apply topically at bedtime. APPLY A PEA SIZED AMOUNT TOPICALLY NIGHTLY.     Dermatology:  Acne preparations Passed - 10/20/2022 11:04 AM      Passed - Valid encounter within last 12 months    Recent Outpatient Visits           2 months ago Encounter for annual physical exam   [place] [facility] [place], Marzella Schlein, MD   5 months ago Primary hypertension   [place] [facility] [place], Marzella Schlein, MD       Future Appointments             In 4 months Bacigalupo, Marzella Schlein, MD [facility], [facility]

## 2022-10-20 NOTE — Telephone Encounter (Signed)
Medication Refill - Medication: tretinoin (RETIN-A) 0.1 % cream [[id number]]   Pt is calling to report that the pharmacy has not received the medication. Requesting if the medication can be resent  Has the patient contacted their pharmacy? Yes.   (Agent: If no, request that the patient contact the pharmacy for the refill. If patient does not wish to contact the pharmacy document the reason why and proceed with request.) (Agent: If yes, when and what did the pharmacy advise?)  Preferred Pharmacy (with phone number or street name[id number] [place], [place] [address] Phone: [phone]  Fax: [phone]   Has the patient been seen for an appointment in the last year OR does the patient have an upcoming appointment? Yes.    Agent: Please be advised that RX refills may take up to 3 business days. We ask that you follow-up with your pharmacy.

## 2023-01-04 ENCOUNTER — Other Ambulatory Visit: Payer: Self-pay | Admitting: Family Medicine

## 2023-01-13 ENCOUNTER — Other Ambulatory Visit: Payer: Self-pay | Admitting: Family Medicine

## 2023-01-15 NOTE — Telephone Encounter (Signed)
 Requested Prescriptions  Pending Prescriptions Disp Refills   metoprolol  succinate (TOPROL -XL) 25 MG 24 hr tablet [Pharmacy Med Name: METOPROLOL  SUCC ER 25 MG TAB] 90 tablet 0    Sig: TAKE 1 TABLET (25 MG TOTAL) BY MOUTH DAILY.     Cardiovascular:  Beta Blockers Failed - 01/15/2023  5:52 PM      Failed - Last BP in normal range    BP Readings from Last 1 Encounters:  08/18/22 (!) 143/78         Passed - Last Heart Rate in normal range    Pulse Readings from Last 1 Encounters:  08/18/22 66         Passed - Valid encounter within last 6 months    Recent Outpatient Visits           5 months ago Encounter for annual physical exam   [place] [facility] [place], Jon HERO, MD   8 months ago Primary hypertension   [facility] [place], Jon HERO, MD       Future Appointments             In 1 month Bacigalupo, Jon HERO, MD [facility]

## 2023-01-16 ENCOUNTER — Other Ambulatory Visit: Payer: Self-pay | Admitting: Family Medicine

## 2023-01-17 NOTE — Telephone Encounter (Signed)
 Requested Prescriptions  Pending Prescriptions Disp Refills   losartan  (COZAAR ) 50 MG tablet [Pharmacy Med Name: LOSARTAN  POTASSIUM 50 MG TAB] 90 tablet 0    Sig: TAKE 1 TABLET BY MOUTH EVERY DAY     Cardiovascular:  Angiotensin Receptor Blockers Failed - 01/17/2023  8:33 AM      Failed - Last BP in normal range    BP Readings from Last 1 Encounters:  08/18/22 (!) 143/78         Passed - Cr in normal range and within 180 days    Creatinine, Ser  Date Value Ref Range Status  08/21/2022 0.97 0.57 - 1.00 mg/dL Final         Passed - K in normal range and within 180 days    Potassium  Date Value Ref Range Status  08/21/2022 4.1 3.5 - 5.2 mmol/L Final         Passed - Patient is not pregnant      Passed - Valid encounter within last 6 months    Recent Outpatient Visits           5 months ago Encounter for annual physical exam   [place] [facility], Stan Eans, MD   8 months ago Primary hypertension   [place] [facility] [place], Stan Eans, MD       Future Appointments             In 1 month Bacigalupo, Stan Eans, MD [facility]

## 2023-01-31 ENCOUNTER — Other Ambulatory Visit: Payer: Self-pay | Admitting: Family Medicine

## 2023-01-31 ENCOUNTER — Telehealth: Payer: Self-pay

## 2023-01-31 MED ORDER — GLUCOSE BLOOD VI STRP: 1.0000 | ORAL_STRIP | Freq: Every day | 3 refills | Status: DC

## 2023-01-31 NOTE — Addendum Note (Signed)
Addended by: Lily Kocher on: 01/31/2023 10:44 AM   Modules accepted: Orders

## 2023-01-31 NOTE — Telephone Encounter (Signed)
Copied from CRM [id number]. Topic: General - Other >> Jan 29, 2023  1:36 PM Macon Large wrote: Reason for CRM: Pt requests a Rx for Accu-Chek Guide test strips. Pt requests that the Rx be sent in for 100 test strips.

## 2023-02-01 ENCOUNTER — Other Ambulatory Visit: Payer: Self-pay

## 2023-02-01 DIAGNOSIS — R7303 Prediabetes: Secondary | ICD-10-CM

## 2023-02-01 MED ORDER — ACCU-CHEK GUIDE TEST VI STRP: ORAL_STRIP | 12 refills | Status: DC

## 2023-02-01 NOTE — Telephone Encounter (Signed)
Requested medication (s) are due for refill today: No  Requested medication (s) are on the active medication list: Yes  Last refill:  01/31/23  Future visit scheduled: Yes  Notes to clinic:  See pharmacy request.    Requested Prescriptions  Pending Prescriptions Disp Refills   ACCU-CHEK GUIDE TEST test strip [Pharmacy Med Name: ACCU-CHEK GUIDE TEST STRIP] 100 strip 3    Sig: 1 EACH BY OTHER ROUTE DAILY. USE 1 NEW STRIP AS DIRECTED TO CHECK BLOOD SUGAR ONCE DAILY     There is no refill protocol information for this order

## 2023-03-05 ENCOUNTER — Ambulatory Visit: Payer: 59 | Admitting: Family Medicine

## 2023-03-05 ENCOUNTER — Other Ambulatory Visit (HOSPITAL_COMMUNITY)
Admission: RE | Admit: 2023-03-05 | Discharge: 2023-03-05 | Disposition: A | Discharge status: Home / Self Care | Source: Ambulatory Visit | Attending: Family Medicine | Admitting: Family Medicine

## 2023-03-05 ENCOUNTER — Encounter: Payer: Self-pay | Admitting: Family Medicine

## 2023-03-05 VITALS — BP 131/89 | HR 92 | Ht 63.5 in | Wt 146.4 lb

## 2023-03-05 DIAGNOSIS — I10 Essential (primary) hypertension: Secondary | ICD-10-CM | POA: Diagnosis not present

## 2023-03-05 DIAGNOSIS — Z124 Encounter for screening for malignant neoplasm of cervix: Secondary | ICD-10-CM | POA: Diagnosis present

## 2023-03-05 DIAGNOSIS — R7303 Prediabetes: Secondary | ICD-10-CM | POA: Diagnosis not present

## 2023-03-05 DIAGNOSIS — E782 Mixed hyperlipidemia: Secondary | ICD-10-CM | POA: Diagnosis not present

## 2023-03-05 NOTE — Assessment & Plan Note (Signed)
 Reviewed last lipid panel Not currently on a statin Recheck FLP and CMP annually Discussed diet and exercise

## 2023-03-05 NOTE — Progress Notes (Signed)
 Established patient visit   Patient: Sylvia Beasley   DOB: [birth year]   58 y.o. Female  MRN: [id number] Visit Date: 03/05/2023  Today's healthcare provider: Shirlee Latch, MD   Chief Complaint  Patient presents with   Medical Management of Chronic Issues    6 month follow-up. Patient last seen on 08/18/22   Hypertension    Encouraged to continue home monitoring and adherence to low sodium diet as well as taking prescribed medication. Patient reports monitoring at home with it ranging around 120's/80's. Symptoms: none   Pre-Diabetes    Encouraged to consume a low carb diet to prevent progression. Patient reports no symptoms and monitoring at home. Blood sugar anywhere from 84-100 mg/dL fasting.Reports consuming more protein with meals and has noticed when she does the next morning her blood sugar is pretty normal.    Care Management   Subjective    HPI HPI     Medical Management of Chronic Issues    Additional comments: 6 month follow-up. Patient last seen on 08/18/22        Hypertension    Additional comments: Encouraged to continue home monitoring and adherence to low sodium diet as well as taking prescribed medication. Patient reports monitoring at home with it ranging around 120's/80's. Symptoms: none        Pre-Diabetes    Additional comments: Encouraged to consume a low carb diet to prevent progression. Patient reports no symptoms and monitoring at home. Blood sugar anywhere from 84-100 mg/dL fasting.Reports consuming more protein with meals and has noticed when she does the next morning her blood sugar is pretty normal.       Last edited by Acey Lav, CMA on 03/05/2023 11:15 AM.       Discussed the use of AI scribe software for clinical note transcription with the patient, who gave verbal consent to proceed.  History of Present Illness   The patient presents for a routine six-month check-up. She reports no concerns today. She has a history of  prediabetes and hypertension, which are being managed with losartan 50mg  daily and metoprolol 25mg  daily. The patient reports no problems with these medications. She has been monitoring her blood sugar at home, which has been running between 84 and 100 fasting. She has noticed that incorporating protein into each meal has helped regulate her blood sugar levels.  The patient has also been using coconut oil as a substitute for butter and olive oil in her cooking. She has noticed that certain foods do not fry well with coconut oil and has been using olive oil for those. She cooks exclusively with cast iron due to her iron levels.  The patient has been walking every day and has noticed an improvement in her oxygen levels and endurance. She has been able to play tennis without needing to stop as frequently. She has also switched from slow fed iron to high potency iron, which she believes may have contributed to her improved oxygen levels.         Medications: Outpatient Medications Prior to Visit  Medication Sig   losartan (COZAAR) 50 MG tablet TAKE 1 TABLET BY MOUTH EVERY DAY   metoprolol succinate (TOPROL-XL) 25 MG 24 hr tablet TAKE 1 TABLET (25 MG TOTAL) BY MOUTH DAILY.   [DISCONTINUED] BLACK COHOSH PO Take by mouth. (Patient not taking: Reported on 03/05/2023)   [DISCONTINUED] Cats Claw, Uncaria tomentosa, (CATS CLAW PO) Take by mouth. (Patient not taking: Reported on 03/05/2023)   [DISCONTINUED]  cholecalciferol (VITAMIN D) 1000 UNITS tablet Take 1,000 Units by mouth. (Patient not taking: Reported on 03/05/2023)   [DISCONTINUED] estradiol (ESTRACE) 0.1 MG/GM vaginal cream PLACE 1 APPLICATORFUL VAGINALLY 3 (THREE) TIMES A WEEK. (Patient not taking: Reported on 03/05/2023)   [DISCONTINUED] Ferrous Sulfate (SLOW FE) 142 (45 Fe) MG TBCR Take by mouth.   [DISCONTINUED] gabapentin (NEURONTIN) 300 MG capsule Take 1 capsule (300 mg total) by mouth at bedtime.   [DISCONTINUED] glucose blood (ACCU-CHEK GUIDE TEST)  test strip Use as instructed (Patient not taking: Reported on 03/05/2023)   [DISCONTINUED] glucose blood test strip 1 each by Other route daily. USE 1 NEW STRIP AS DIRECTED TO CHECK BLOOD SUGAR ONCE DAILY (Patient not taking: Reported on 03/05/2023)   [DISCONTINUED] loratadine (CLARITIN) 10 MG tablet Take 10 mg by mouth. (Patient not taking: Reported on 03/05/2023)   [DISCONTINUED] tretinoin (RETIN-A) 0.1 % cream Apply topically at bedtime. APPLY A PEA SIZED AMOUNT TOPICALLY NIGHTLY. (Patient not taking: Reported on 03/05/2023)   [DISCONTINUED] triamcinolone cream (KENALOG) 0.5 % Apply topically. (Patient not taking: Reported on 03/05/2023)   No facility-administered medications prior to visit.    Review of Systems      Objective    BP 131/89 (BP Location: Right Arm, Patient Position: Sitting, Cuff Size: Normal)   Pulse 92   Ht 5' 3.5" (1.613 m)   Wt 146 lb 6.4 oz (66.4 kg)   LMP 05/22/2015   SpO2 100%   BMI 25.53 kg/m    Physical Exam Vitals reviewed.  Constitutional:      General: She is not in acute distress.    Appearance: Normal appearance. She is well-developed. She is not diaphoretic.  HENT:     Head: Normocephalic and atraumatic.  Eyes:     General: No scleral icterus.    Conjunctiva/sclera: Conjunctivae normal.  Neck:     Thyroid: No thyromegaly.  Cardiovascular:     Rate and Rhythm: Normal rate and regular rhythm.     Heart sounds: Normal heart sounds. No murmur heard. Pulmonary:     Effort: Pulmonary effort is normal. No respiratory distress.     Breath sounds: Normal breath sounds. No wheezing, rhonchi or rales.  Genitourinary:    Comments: GYN:  External genitalia within normal limits.  Vaginal mucosa pink, moist, normal rugae.  Nonfriable cervix without lesions, no discharge or bleeding noted on speculum exam.   Musculoskeletal:     Cervical back: Neck supple.     Right lower leg: No edema.     Left lower leg: No edema.  Lymphadenopathy:     Cervical: No cervical  adenopathy.  Skin:    General: Skin is warm and dry.     Findings: No rash.  Neurological:     Mental Status: She is alert and oriented to person, place, and time. Mental status is at baseline.  Psychiatric:        Mood and Affect: Mood normal.        Behavior: Behavior normal.      No results found for any visits on 03/05/23.  Assessment & Plan     Problem List Items Addressed This Visit       Cardiovascular and Mediastinum   Primary hypertension - Primary   Blood pressure well-controlled on losartan 50 mg daily and metoprolol 25 mg daily. No reported issues with medications. - Continue losartan 50 mg daily - Continue metoprolol 25 mg daily      Relevant Orders   Basic Metabolic Panel (BMET)  Other   Moderate mixed hyperlipidemia not requiring statin therapy   Reviewed last lipid panel Not currently on a statin Recheck FLP and CMP annually Discussed diet and exercise       Prediabetes   Six-month follow-up for prediabetes. Home blood sugar levels range from 84 to 100 fasting, within normal limits. Improved regulation with increased protein intake. Concern about recent dietary lapses with sweet shakes. Encouraged by current blood sugar levels, anticipating a better A1c result. - Order A1c test - Order kidney function and electrolytes tests - Recheck cholesterol at next physical - Encourage continued balanced meals with protein      Relevant Orders   Basic Metabolic Panel (BMET)   Hemoglobin A1c   Other Visit Diagnoses       Cervical cancer screening       Relevant Orders   Cytology - PAP           Cervical Cancer Screening Due for a PAP smear. Last PAP in 2022 without HPV co-testing. Discussed and opted to perform PAP smear today. - Perform PAP smear today after lab tests  General Health Maintenance Discussed using coconut oil as a substitute for butter and olive oil. Reviewed health benefits, including fatty acid profile and heat properties. Oxygen  levels consistently good, likely due to increased physical activity and iron supplementation. - Encourage use of coconut oil as a healthier alternative to butter - Continue current iron supplementation - Encourage continued physical activity  Follow-up - Schedule physical exam in August.        Return in about 6 months (around 09/05/2023) for CPE.       Shirlee Latch, MD  [facility] [phone] (phone) [phone] (fax)  [facility]

## 2023-03-05 NOTE — Assessment & Plan Note (Signed)
 Six-month follow-up for prediabetes. Home blood sugar levels range from 84 to 100 fasting, within normal limits. Improved regulation with increased protein intake. Concern about recent dietary lapses with sweet shakes. Encouraged by current blood sugar levels, anticipating a better A1c result. - Order A1c test - Order kidney function and electrolytes tests - Recheck cholesterol at next physical - Encourage continued balanced meals with protein

## 2023-03-05 NOTE — Assessment & Plan Note (Signed)
 Blood pressure well-controlled on losartan 50 mg daily and metoprolol 25 mg daily. No reported issues with medications. - Continue losartan 50 mg daily - Continue metoprolol 25 mg daily

## 2023-03-06 LAB — BASIC METABOLIC PANEL
BUN/Creatinine Ratio: 14 (ref 9–23)
BUN: 14 mg/dL (ref 6–24)
CO2: 26 mmol/L (ref 20–29)
Calcium: 10 mg/dL (ref 8.7–10.2)
Chloride: 102 mmol/L (ref 96–106)
Creatinine, Ser: 1.02 mg/dL — ABNORMAL HIGH (ref 0.57–1.00)
Glucose: 81 mg/dL (ref 70–99)
Potassium: 3.9 mmol/L (ref 3.5–5.2)
Sodium: 141 mmol/L (ref 134–144)
eGFR: 63 mL/min/{1.73_m2} (ref >59)

## 2023-03-06 LAB — HEMOGLOBIN A1C
Est. average glucose Bld gHb Est-mCnc: 126 mg/dL
Hgb A1c MFr Bld: 6 % — ABNORMAL HIGH (ref 4.8–5.6)

## 2023-03-08 ENCOUNTER — Encounter: Payer: Self-pay | Admitting: Family Medicine

## 2023-03-08 ENCOUNTER — Other Ambulatory Visit: Payer: Self-pay

## 2023-03-08 LAB — CYTOLOGY - PAP
Comment: NEGATIVE
Diagnosis: NEGATIVE
High risk HPV: NEGATIVE

## 2023-04-13 ENCOUNTER — Other Ambulatory Visit: Payer: Self-pay | Admitting: Family Medicine

## 2023-04-13 NOTE — Telephone Encounter (Signed)
 Requested Prescriptions  Pending Prescriptions Disp Refills   metoprolol succinate (TOPROL-XL) 25 MG 24 hr tablet [Pharmacy Med Name: METOPROLOL SUCC ER 25 MG TAB] 90 tablet 1    Sig: TAKE 1 TABLET (25 MG TOTAL) BY MOUTH DAILY.     Cardiovascular:  Beta Blockers Passed - 04/13/2023  1:46 PM      Passed - Last BP in normal range    BP Readings from Last 1 Encounters:  03/05/23 131/89         Passed - Last Heart Rate in normal range    Pulse Readings from Last 1 Encounters:  03/05/23 92         Passed - Valid encounter within last 6 months    Recent Outpatient Visits           1 month ago Primary hypertension   [facility] [place], Marzella Schlein, MD       Future Appointments             In 5 months Bacigalupo, Marzella Schlein, MD [facility]             losartan (COZAAR) 50 MG tablet [Pharmacy Med Name: LOSARTAN POTASSIUM 50 MG TAB] 90 tablet 1    Sig: TAKE 1 TABLET BY MOUTH EVERY DAY     Cardiovascular:  Angiotensin Receptor Blockers Failed - 04/13/2023  1:46 PM      Failed - Cr in normal range and within 180 days    Creatinine, Ser  Date Value Ref Range Status  03/05/2023 1.02 (H) 0.57 - 1.00 mg/dL Final         Passed - K in normal range and within 180 days    Potassium  Date Value Ref Range Status  03/05/2023 3.9 3.5 - 5.2 mmol/L Final         Passed - Patient is not pregnant      Passed - Last BP in normal range    BP Readings from Last 1 Encounters:  03/05/23 131/89         Passed - Valid encounter within last 6 months    Recent Outpatient Visits           1 month ago Primary hypertension   [place] [facility] [place], Marzella Schlein, MD       Future Appointments             In 5 months Bacigalupo, Marzella Schlein, MD [facility]

## 2023-04-15 IMAGING — MG MM DIGITAL SCREENING BILAT W/ TOMO AND CAD
8 series · 8 of 24 positions shown · non-contrast
Comparison: Previous exam(s).

CLINICAL DATA: Screening.

EXAM:
DIGITAL SCREENING BILATERAL MAMMOGRAM WITH TOMOSYNTHESIS AND CAD
TECHNIQUE: Bilateral screening digital craniocaudal and mediolateral oblique
mammograms were obtained. Bilateral screening digital breast
tomosynthesis was performed. The images were evaluated with
computer-aided detection.

[L CC synth-2D]
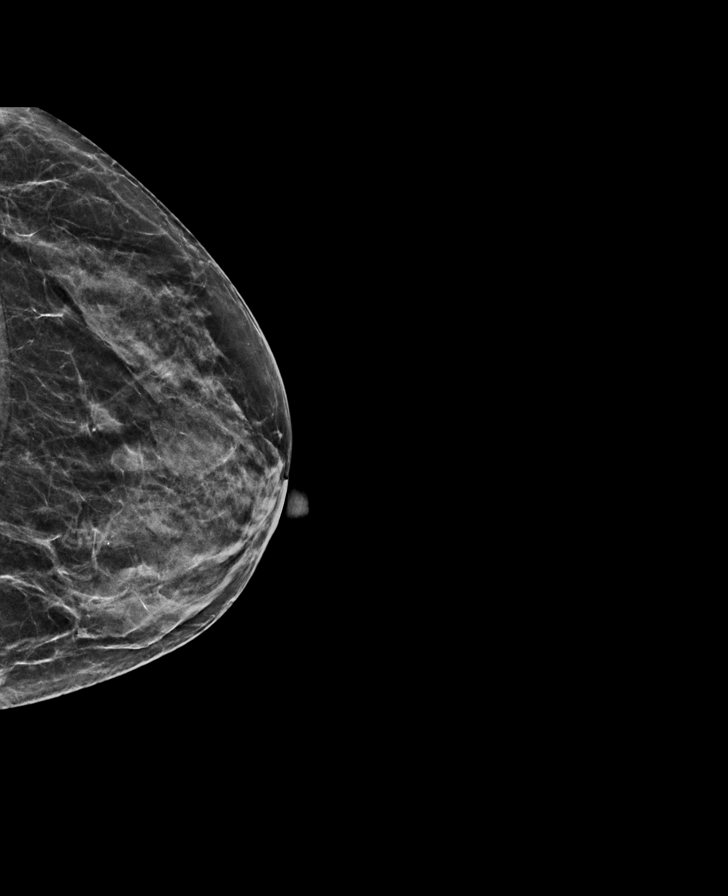

[R CC synth-2D]
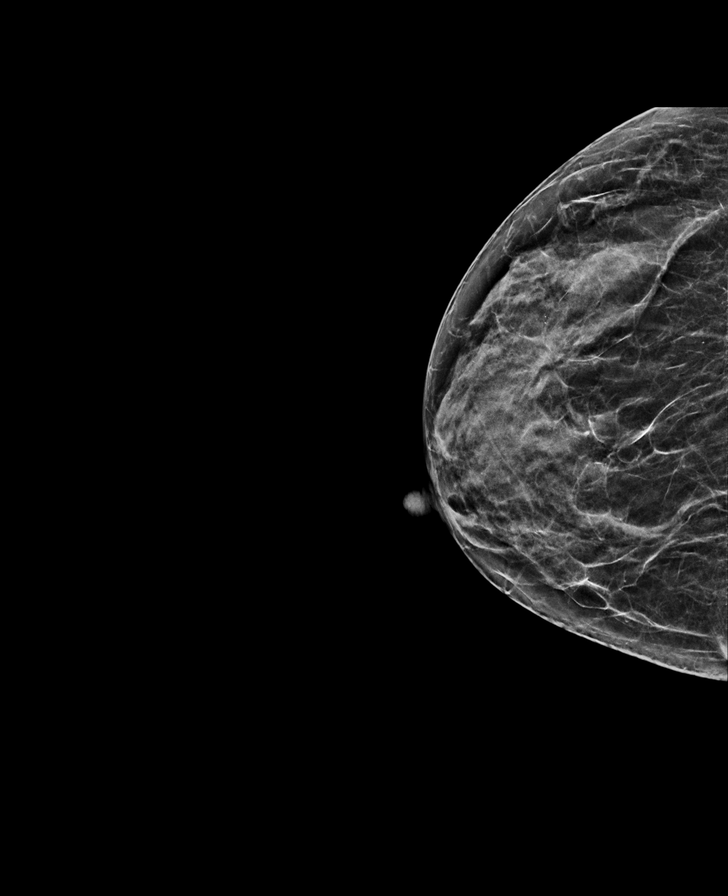

[L MLO synth-2D]
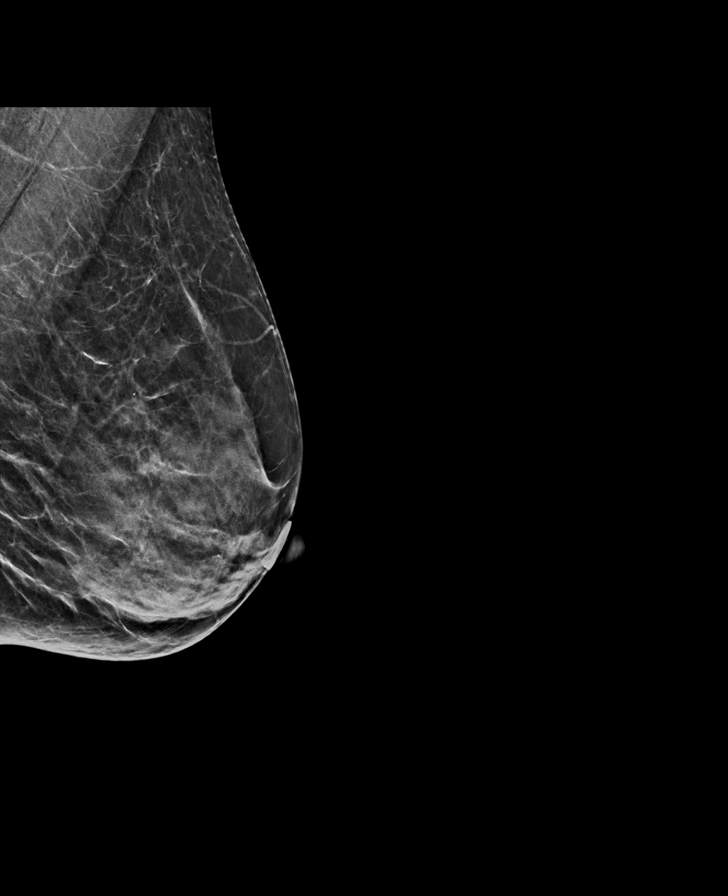

[R MLO synth-2D]
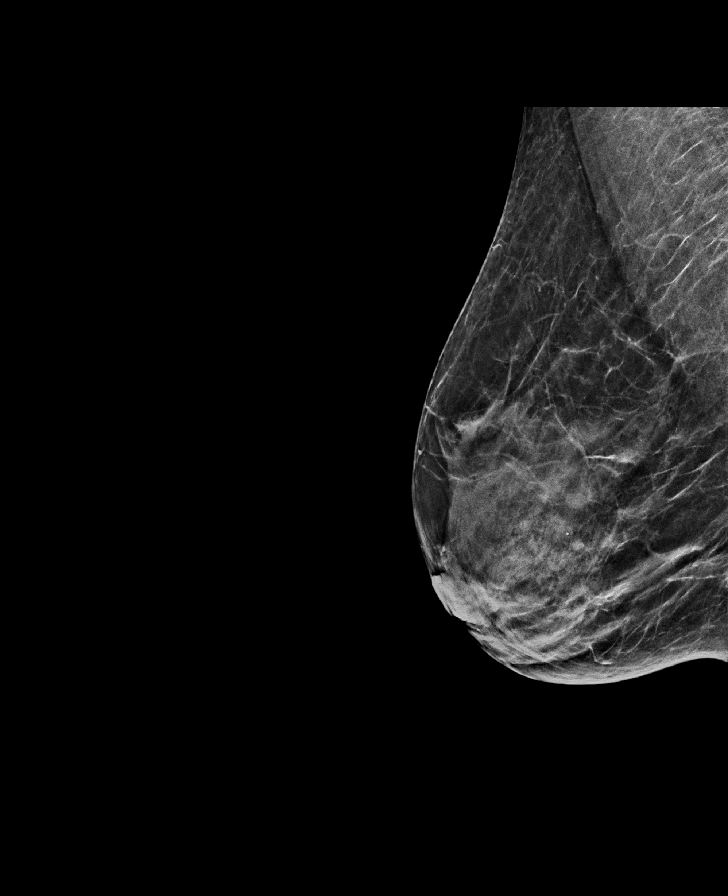

[R MLO tomo · tomo slice 29/56.0]
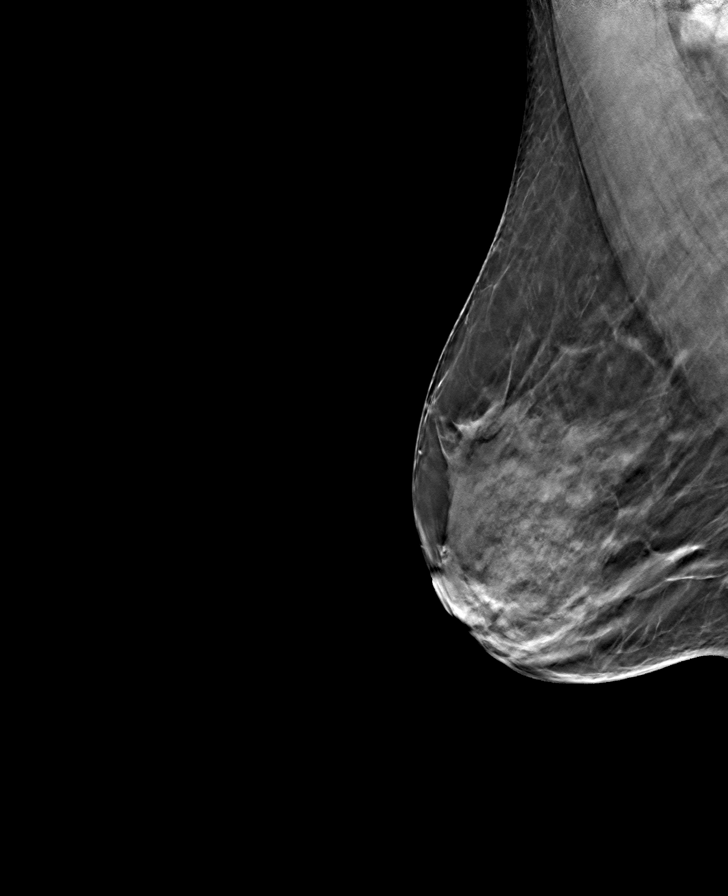

[R CC tomo · tomo slice 25/49.0]
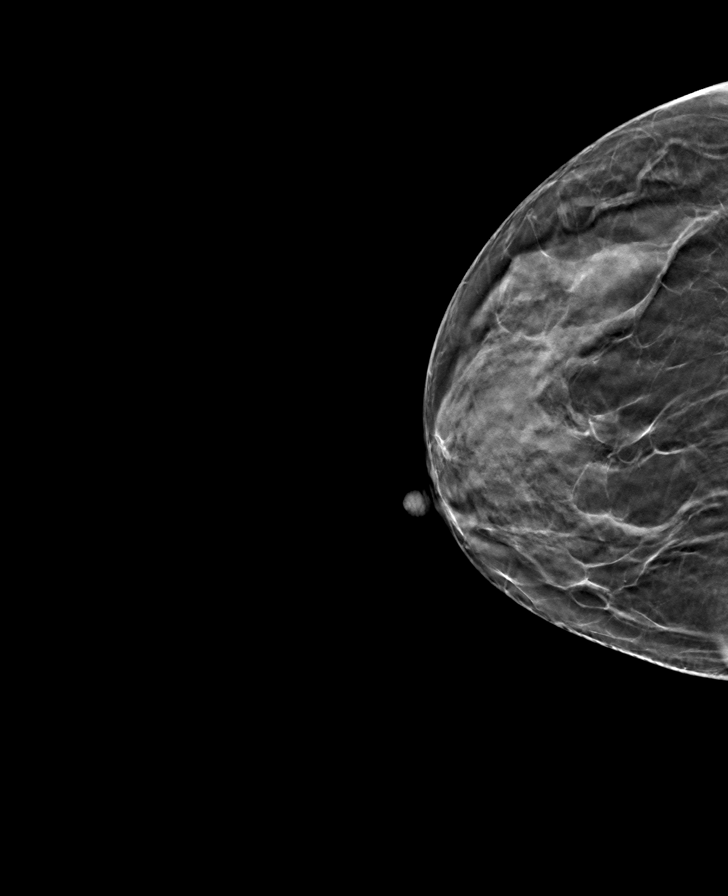

[L MLO tomo · tomo slice 26/51.0]
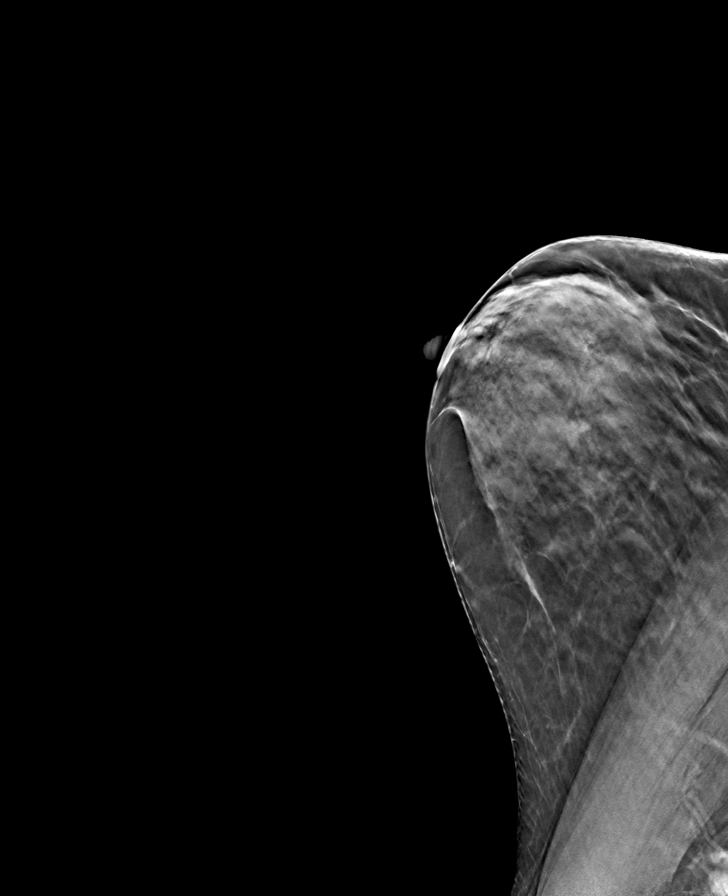

[L CC tomo · tomo slice 27/52.0]
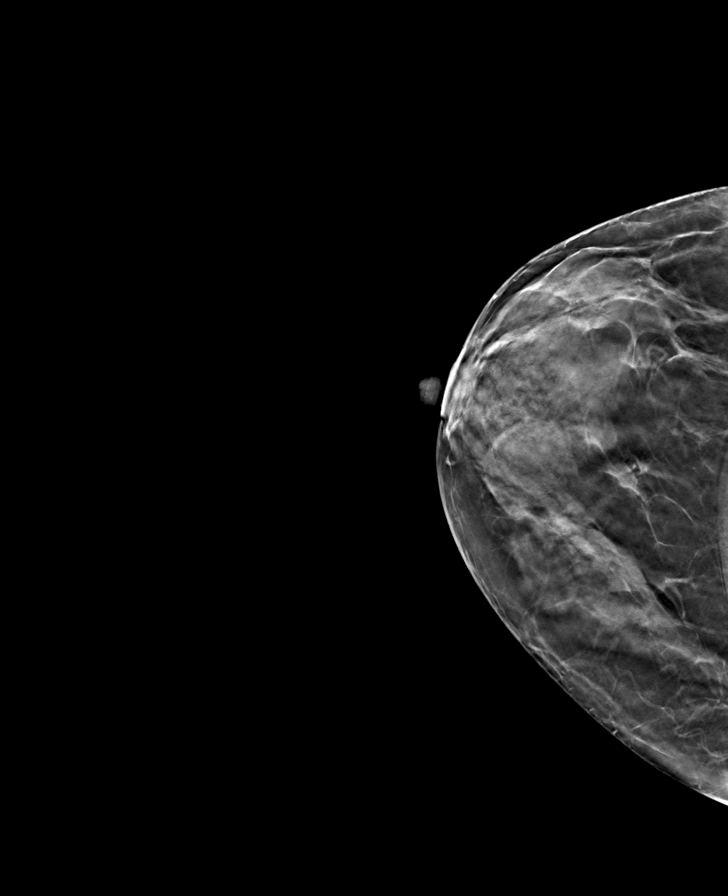

[8 of 24 positions shown; findings below may reference images not displayed]

ACR Breast Density Category c: The breast tissue is heterogeneously
dense, which may obscure small masses.
FINDINGS: There are no findings suspicious for malignancy.
IMPRESSION: No mammographic evidence of malignancy. A result letter of this
screening mammogram will be mailed directly to the patient.

RECOMMENDATION:
Screening mammogram in one year. (Code:Q3-W-BC3)

BI-RADS CATEGORY  1: Negative.

## 2023-05-18 ENCOUNTER — Encounter: Payer: Self-pay | Admitting: Physician Assistant

## 2023-05-18 ENCOUNTER — Ambulatory Visit (INDEPENDENT_AMBULATORY_CARE_PROVIDER_SITE_OTHER): Admitting: Physician Assistant

## 2023-05-18 VITALS — BP 127/82 | HR 64 | Resp 16 | Ht 63.0 in | Wt 146.3 lb

## 2023-05-18 DIAGNOSIS — M21612 Bunion of left foot: Secondary | ICD-10-CM | POA: Diagnosis not present

## 2023-05-18 DIAGNOSIS — M79675 Pain in left toe(s): Secondary | ICD-10-CM | POA: Diagnosis not present

## 2023-05-18 MED ORDER — DOXYCYCLINE HYCLATE 100 MG PO TABS: 100.0000 mg | ORAL_TABLET | Freq: Two times a day (BID) | ORAL | 0 refills | Status: DC

## 2023-05-18 NOTE — Progress Notes (Signed)
 Established patient visit  Patient: Sylvia Beasley   DOB: Mar 25, 1965   58 y.o. Female  MRN: [id number] Visit Date: 05/18/2023  Today's healthcare provider: Blane Bunting, PA-C   Chief Complaint  Patient presents with   Foot Pain    Lt Ft Pain big toe  x1 week   Subjective     Discussed the use of AI scribe software for clinical note transcription with the patient, who gave verbal consent to proceed.  History of Present Illness Sylvia Beasley is a 58 year old female with a history of bunions who presents with worsening foot pain.  She experiences severe foot pain in a gland on her foot, worsening over the past week, and now occurs even without tight shoes. The pain is severe enough to wake her at 3 AM. She uses a bunion splint and pain medication for relief. Her bunion has enlarged and become more painful, with the area turning darker. She has not previously sought medical attention for this issue.  There is no history of gout and no family history of gout. She is prediabetic and takes blood pressure medication, with no recent changes in her medication regimen. She has not had imaging or lab work for this issue and has not seen a podiatrist before. Her sister had a similar condition and underwent surgery.  She does not smoke or use alcohol. Pain worsens with movement and occurs spontaneously, especially at night.       05/18/2023    1:27 PM 03/05/2023   11:14 AM 08/18/2022   11:11 AM  Depression screen PHQ 2/9  Decreased Interest 0 0 0  Down, Depressed, Hopeless 0 0 0  PHQ - 2 Score 0 0 0  Altered sleeping 1    Tired, decreased energy 0    Change in appetite 0    Feeling bad or failure about yourself  0    Trouble concentrating 0    Moving slowly or fidgety/restless 0    Suicidal thoughts 0    PHQ-9 Score 1    Difficult doing work/chores Not difficult at all        05/18/2023    1:27 PM 03/05/2023   11:14 AM  GAD 7 : Generalized Anxiety Score  Nervous, Anxious, on Edge 0 0   Control/stop worrying 0 0  Worry too much - different things 0 0  Trouble relaxing 0 0  Restless 0 0  Easily annoyed or irritable 0 0  Afraid - awful might happen 0 0  Total GAD 7 Score 0 0  Anxiety Difficulty Not difficult at all Not difficult at all    Medications: Outpatient Medications Prior to Visit  Medication Sig   losartan  (COZAAR ) 50 MG tablet TAKE 1 TABLET BY MOUTH EVERY DAY   metoprolol  succinate (TOPROL -XL) 25 MG 24 hr tablet TAKE 1 TABLET (25 MG TOTAL) BY MOUTH DAILY.   No facility-administered medications prior to visit.    Review of Systems All negative Except see HPI       Objective    Pulse 64   Resp 16   Ht 5\' 3"  (1.6 m)   Wt 146 lb 4.8 oz (66.4 kg)   LMP 05/22/2015   SpO2 99%   BMI 25.92 kg/m     Physical Exam Constitutional:      General: She is not in acute distress.    Appearance: Normal appearance.  HENT:     Head: Normocephalic.  Pulmonary:     Effort: Pulmonary effort is normal.  No respiratory distress.  Musculoskeletal:        General: Tenderness and deformity present.     Comments: Erythema and swelling around the first metatarsal head observed. Tenderness over the bunion area on palpation noted.   Neurological:     Mental Status: She is alert and oriented to person, place, and time. Mental status is at baseline.      No results found for any visits on 05/18/23.      Assessment & Plan Bunion inflamed Chronic bunion with recent exacerbation of pain and inflammation. She prefers surgical intervention and requests referral to a podiatrist. - Refer to podiatry for evaluation and potential surgical intervention. - Advise use of NSAIDs and acetaminophen for pain management. Denies labwork and imaging. Pain of left great toe (Primary) - Ambulatory referral to Podiatry - doxycycline (VIBRA-TABS) 100 MG tablet; Take 1 tablet (100 mg total) by mouth 2 (two) times daily.  Dispense: 20 tablet; Refill: 0 In case there will be  symptoms/signs of infections.  The patient was advised to call back or seek an in-person evaluation if the symptoms worsen or if the condition fails to improve as anticipated.  Orders Placed This Encounter  Procedures   Ambulatory referral to Podiatry    Referral Priority:   Urgent    Referral Type:   Consultation    Referral Reason:   Specialty Services Required    Requested Specialty:   Podiatry    Number of Visits Requested:   1    No follow-ups on file.    I discussed the assessment and treatment plan with the patient. The patient was provided an opportunity to ask questions and all were answered. The patient agreed with the plan and demonstrated an understanding of the instructions.  I, Tristine Langi, PA-C have reviewed all documentation for this visit. The documentation on 05/18/2023  for the exam, diagnosis, procedures, and orders are all accurate and complete.  Blane Bunting, [facility], MMS [facility] [phone] (phone) [phone] (fax)  [facility]

## 2023-05-22 ENCOUNTER — Telehealth: Payer: Self-pay

## 2023-05-22 DIAGNOSIS — M21612 Bunion of left foot: Secondary | ICD-10-CM

## 2023-05-22 DIAGNOSIS — M79675 Pain in left toe(s): Secondary | ICD-10-CM

## 2023-05-22 NOTE — Telephone Encounter (Signed)
 Sylvia Beasley

## 2023-06-06 ENCOUNTER — Ambulatory Visit (INDEPENDENT_AMBULATORY_CARE_PROVIDER_SITE_OTHER): Payer: Self-pay | Admitting: Podiatry

## 2023-06-06 ENCOUNTER — Ambulatory Visit (INDEPENDENT_AMBULATORY_CARE_PROVIDER_SITE_OTHER)

## 2023-06-06 ENCOUNTER — Encounter: Payer: Self-pay | Admitting: Podiatry

## 2023-06-06 DIAGNOSIS — M2012 Hallux valgus (acquired), left foot: Secondary | ICD-10-CM

## 2023-06-06 DIAGNOSIS — Q66222 Congenital metatarsus adductus, left foot: Secondary | ICD-10-CM | POA: Diagnosis not present

## 2023-06-06 NOTE — Telephone Encounter (Signed)
 Copied from CRM [id number]. Topic: Referral - Question >> Jun 06, 2023  2:58 PM Lizabeth Riggs wrote: Reason for CRM:  Sylvia Beasley wants to get a referral to a new Podiatry that will do shaving of her bunions. Please call and discuss which doctor will do this. Her number is [phone]. Thanks

## 2023-06-07 NOTE — Telephone Encounter (Signed)
Pt advised new order placed.

## 2023-06-07 NOTE — Progress Notes (Signed)
 Subjective:  Patient ID: Sylvia Beasley, female    DOB: 1965/03/19,  MRN: [id number]  Chief Complaint  Patient presents with   Foot Pain    "I have a bunion on my left foot."     Discussed the use of AI scribe software for clinical note transcription with the patient, who gave verbal consent to proceed.  History of Present Illness Sylvia Beasley is a 58 year old female who presents with left foot pain due to a bunion.  She has been experiencing left foot pain primarily over the bunion bump and underneath it. The area had previously turned black but is now regaining color. The pain is described as inflamed.  She has a family history of bunions, as her sister also had one and underwent surgical removal. She has attempted non-surgical measures such as wearing wider shoes, using spacers, and applying padding, but these have not provided relief.  Her medical history includes high blood pressure and prediabetes. She does not smoke and has had a tubal ligation. She also has a history of keloid formation following a needle biopsy on her breast, which was treated with injections but recurred.  No issues with her right foot and no family history of blood clots. She has been informed that her vitamin D  levels are high and takes supplements.      Objective:    Physical Exam VASCULAR: DP and PT pulse palpable. Foot is warm and well-perfused. Capillary fill time is brisk. DERMATOLOGIC: Normal skin turgor, texture, and temperature. No open lesions, rashes, or ulcerations. NEUROLOGIC: Normal sensation to light touch and pressure. No paresthesias. ORTHOPEDIC: Smooth pain-free range of motion of all examined joints. No ecchymosis or bruising. Left foot hallux valgus deformity. Good range of motion of the left MTP joint. Hypermobility at the first left TMT joint.     No images are attached to the encounter.    Results RADIOLOGY Left foot radiograph: Metatarsus adductus deformity underlying hallux  valgus deformity (06/06/2023)   Assessment:   1. Hallux valgus of left foot   2. Congenital metatarsus adductus, left foot      Plan:  Patient was evaluated and treated and all questions answered.  Assessment and Plan Assessment & Plan Hallux valgus deformity with bunion Chronic hallux valgus deformity with bunion on the left foot, causing significant pain. Radiographs reveal metatarsus adductus deformity underlying the hallux valgus. She has tried non-surgical options such as wider shoes, spacers, and padding without relief. She is interested in surgical correction.  She inquired about simply "shaving the bump off", I discussed with her that this would significantly into correct her bunion and lead to recurrence, I discussed with her other surgical options, including the Lapidus procedure, were discussed, emphasizing the impact of metatarsus adductus deformity on correction. She prefers surgery only on the first ray, not the second or third toes, and was informed about the potential for undercorrection and recurrence if the second and third metatarsals are not addressed.  Recovery includes two weeks non-weight bearing and six weeks in a walking boot. Informed consent was signed. - Schedule outpatient lapidus arthrodesis of the first TMT joint. - Order knee scooter for non-weight bearing period. - Discuss non-weight bearing for 2-3 weeks followed by 4-6 weeks in a walking boot. - Review informed consent for surgery.  Metatarsus adductus deformity Metatarsus adductus deformity identified on radiographs. Discussed its potential impact on surgical correction of hallux valgus deformity. She prefers not to address the second and third metatarsals surgically and was informed  about the increased risk of recurrence and undercorrection if not addressed.   Follow-up She desires surgery as soon as possible, to be scheduled at [facility]. Anesthesia and postoperative pain  management were discussed, including deep sedation and a nerve block for postoperative pain control. Titanium plates and screws will be used, with a low likelihood of needing removal. - Coordinate with surgery scheduler to determine surgery date. - Provide cost estimate for surgery from surgeon's office and surgery center. - Discuss anesthesia plan including deep sedation and nerve block for postoperative pain control. -Knee scooter order placed      No follow-ups on file.

## 2023-06-07 NOTE — Addendum Note (Signed)
 Addended by: Darrow End on: 06/07/2023 03:29 PM   Modules accepted: Orders

## 2023-06-08 ENCOUNTER — Telehealth: Payer: Self-pay | Admitting: Podiatry

## 2023-06-08 NOTE — Telephone Encounter (Signed)
 Received surgical consent.  Called pt to confirm pt would like estimates before scheduling. All paperwork has been sent to [facility] and vicki in our billing dept. Made pt aware vicki is out of office so she will not hear back until next week.

## 2023-06-19 ENCOUNTER — Other Ambulatory Visit: Payer: Self-pay | Admitting: Family Medicine

## 2023-06-19 DIAGNOSIS — Z1231 Encounter for screening mammogram for malignant neoplasm of breast: Secondary | ICD-10-CM

## 2023-08-02 ENCOUNTER — Ambulatory Visit (INDEPENDENT_AMBULATORY_CARE_PROVIDER_SITE_OTHER): Admitting: Family Medicine

## 2023-08-02 ENCOUNTER — Encounter: Payer: Self-pay | Admitting: Family Medicine

## 2023-08-02 VITALS — BP 117/84 | HR 83 | Resp 16 | Ht 63.5 in | Wt 140.2 lb

## 2023-08-02 DIAGNOSIS — I10 Essential (primary) hypertension: Secondary | ICD-10-CM

## 2023-08-02 DIAGNOSIS — R7303 Prediabetes: Secondary | ICD-10-CM | POA: Diagnosis not present

## 2023-08-02 DIAGNOSIS — M2550 Pain in unspecified joint: Secondary | ICD-10-CM | POA: Diagnosis not present

## 2023-08-02 MED ORDER — PREDNISONE 10 MG PO TABS: ORAL_TABLET | ORAL | 0 refills | Status: DC

## 2023-08-02 MED ORDER — MELOXICAM 7.5 MG PO TABS: 7.5000 mg | ORAL_TABLET | Freq: Every day | ORAL | 0 refills | Status: DC

## 2023-08-02 NOTE — Assessment & Plan Note (Signed)
 New onset multiple joint arthralgia.  Every large joint, wrist, hands, ankles. Tenderness with movement when she first wakes up Started one month ago after a trip to mountains Does not remember bug bite, but specifically remembers symptoms started after that. Did lay in grass at one point during trip Father hx of rheumatoid arthritis Has been taking aleve - which does help, and pain improves throughout day.  Affecting her daily life, feels weak. Strengths 4/5 all around L middle finger swollen PIP and DIP.  Concerning for acute onset - will check TSH, CRP, ESR, CMP, CBC, A1C, ANA, Lyme Disease, Rheumatoid Factor Six day taper of prednisone   Once steroid pack done - can trial mobic  for joint pain Tylenol prn for pain Referral to Rhem

## 2023-08-02 NOTE — Assessment & Plan Note (Addendum)
 Chronic, stable Lifestyle managed Check LP Will check A1c today with labs, as pt is due for recheck, last level 6.0

## 2023-08-02 NOTE — Assessment & Plan Note (Signed)
 Blood pressure well-controlled on losartan  50 mg daily and metoprolol  25 mg daily. No reported issues with medications. - Continue losartan  50 mg daily - Continue metoprolol  25 mg daily - check metabolic panel today

## 2023-08-02 NOTE — Progress Notes (Signed)
 Acute Office Visit  Introduced to nurse practitioner role and practice setting.  All questions answered.  Discussed provider/patient relationship and expectations.   Subjective:     Patient ID: Sylvia Beasley, female    DOB: 08/27/1965, 58 y.o.   MRN: [id number]  Chief Complaint  Patient presents with   Joint Swelling    Patient reports this has been going on for a month. She is having pain,swelling and stiffness. She has tried Aleve and Advil.    Discussed the use of AI scribe software for clinical note transcription with the patient, who gave verbal consent to proceed.  History of Present Illness      Generalized joint pain for one month. Worsening. Alleviated with naproxen. Worse after sleeping, subtly improves throughout day. Father hx RA. Pt denies fever, but did feel little off a few weeks ago. She went to mountains, not aware of bug bite, but was in grass.   Pain in nearly every joint. She is concerns for arthritis.   Arthritis Presents for initial visit. The disease course has been worsening. The condition has lasted for 1 month. She complains of pain, stiffness and joint swelling. Affected locations include the right wrist, left DIP, left PIP, left wrist, right ankle, left ankle, right elbow, left elbow, right shoulder, left shoulder, right knee, left knee, left MCP and right MCP. Associated symptoms include fatigue and pain at night. Her family medical history includes family history of rheumatoid arthritis. Past treatments include NSAIDs, acetaminophen and rest. The treatment provided mild relief. Factors aggravating her arthritis include gripping and lifting.    Review of Systems  Constitutional:  Positive for fatigue.  Musculoskeletal:  Positive for arthritis, joint swelling and stiffness.        Objective:    BP 117/84 (BP Location: Left Arm, Patient Position: Sitting, Cuff Size: Normal)   Pulse 83   Resp 16   Ht 5' 3.5 (1.613 m)   Wt 140 lb 3.2 oz (63.6 kg)    LMP 05/22/2015   SpO2 98%   BMI 24.45 kg/m    Physical Exam Constitutional:      General: She is not in acute distress.    Appearance: Normal appearance. She is normal weight. She is not toxic-appearing or diaphoretic.  HENT:     Head: Normocephalic.     Nose: Nose normal.     Mouth/Throat:     Mouth: Mucous membranes are moist.     Pharynx: Oropharynx is clear.  Eyes:     Extraocular Movements: Extraocular movements intact.     Pupils: Pupils are equal, round, and reactive to light.  Neck:     Vascular: No carotid bruit.  Cardiovascular:     Rate and Rhythm: Normal rate and regular rhythm.     Pulses: Normal pulses.     Heart sounds: Normal heart sounds. No murmur heard.    No friction rub. No gallop.  Pulmonary:     Effort: No respiratory distress.     Breath sounds: No stridor. No wheezing, rhonchi or rales.  Chest:     Chest wall: No tenderness.  Abdominal:     General: Abdomen is flat. Bowel sounds are normal.     Palpations: Abdomen is soft.  Musculoskeletal:        General: Swelling and tenderness present.     Right shoulder: Decreased strength.     Left shoulder: Decreased strength.     Right wrist: Tenderness present.     Left wrist: Tenderness  present.     Right hand: Decreased strength. Normal sensation. Normal capillary refill. Normal pulse.     Left hand: Swelling present. Decreased strength. Normal sensation. Normal capillary refill. Normal pulse.     Right lower leg: No edema.     Left lower leg: No edema.     Comments: Left middle finger swollen. Generalized tenderness to movement. Generalized decreased strength 4/5  Lymphadenopathy:     Cervical: No cervical adenopathy.  Skin:    General: Skin is warm and dry.     Capillary Refill: Capillary refill takes less than 2 seconds.  Neurological:     General: No focal deficit present.     Mental Status: She is alert and oriented to person, place, and time. Mental status is at baseline.     Cranial  Nerves: No cranial nerve deficit.     Sensory: No sensory deficit.     Motor: Weakness present.     Coordination: Coordination normal.     Gait: Gait normal.  Psychiatric:        Mood and Affect: Mood is depressed. Affect is flat.        Behavior: Behavior normal. Behavior is cooperative.        Thought Content: Thought content normal.        Judgment: Judgment normal.     No results found for any visits on 08/02/23.      Assessment & Plan:  Assessment and Plan Assessment & Plan      Problem List Items Addressed This Visit       Cardiovascular and Mediastinum   Primary hypertension   Blood pressure well-controlled on losartan  50 mg daily and metoprolol  25 mg daily. No reported issues with medications. - Continue losartan  50 mg daily - Continue metoprolol  25 mg daily - check metabolic panel today        Relevant Orders   Comprehensive metabolic panel with GFR     Other   Prediabetes   Chronic, stable Lifestyle managed Check LP Will check A1c today with labs, as pt is due for recheck, last level 6.0      Relevant Orders   Lipid panel   HgB A1c   Arthralgia - Primary   New onset multiple joint arthralgia.  Every large joint, wrist, hands, ankles. Tenderness with movement when she first wakes up Started one month ago after a trip to mountains Does not remember bug bite, but specifically remembers symptoms started after that. Did lay in grass at one point during trip Father hx of rheumatoid arthritis Has been taking aleve - which does help, and pain improves throughout day.  Affecting her daily life, feels weak. Strengths 4/5 all around L middle finger swollen PIP and DIP.  Concerning for acute onset - will check TSH, CRP, ESR, CMP, CBC, A1C, ANA, Lyme Disease, Rheumatoid Factor Six day taper of prednisone   Once steroid pack done - can trial mobic  for joint pain Tylenol prn for pain Referral to Rhem      Relevant Medications   predniSONE  (DELTASONE ) 10  MG tablet   meloxicam  (MOBIC ) 7.5 MG tablet   Other Relevant Orders   CBC with Differential/Platelet   Rheumatoid factor   Sedimentation rate   CBC   ANA w/Reflex if Positive   C-reactive protein   CYCLIC CITRUL PEPTIDE ANTIBODY, IGG/IGA   TSH   Ambulatory referral to Rheumatology   Lyme Disease, Western Blot    Meds ordered this encounter  Medications   predniSONE  (  DELTASONE ) 10 MG tablet    Sig: Take 60mg  PO daily x1d, then 50mg  daily x1d, then 40mg  daily x1d, then 30mg  daily x1d, then 20mg  daily x1d, then 10mg  daily x1d, then stop    Dispense:  21 tablet    Refill:  0   meloxicam  (MOBIC ) 7.5 MG tablet    Sig: Take 1 tablet (7.5 mg total) by mouth daily. TAKE AFTER steroid pack is finished.    Dispense:  30 tablet    Refill:  0    Return if symptoms worsen or fail to improve.  Curtis DELENA Boom, FNP  I, Curtis DELENA Boom, FNP, have reviewed all documentation for this visit. The documentation on 08/02/23 for the exam, diagnosis, procedures, and orders are all accurate and complete.

## 2023-08-03 ENCOUNTER — Ambulatory Visit: Payer: Self-pay | Admitting: Family Medicine

## 2023-08-03 ENCOUNTER — Ambulatory Visit
Admission: RE | Admit: 2023-08-03 | Discharge: 2023-08-03 | Disposition: A | Discharge status: Home / Self Care | Source: Ambulatory Visit | Attending: Family Medicine | Admitting: Family Medicine

## 2023-08-03 DIAGNOSIS — Z1231 Encounter for screening mammogram for malignant neoplasm of breast: Secondary | ICD-10-CM | POA: Insufficient documentation

## 2023-08-04 LAB — COMPREHENSIVE METABOLIC PANEL WITH GFR
ALT: 19 IU/L (ref 0–32)
AST: 24 IU/L (ref 0–40)
Albumin: 4.4 g/dL (ref 3.8–4.9)
Alkaline Phosphatase: 115 IU/L (ref 44–121)
BUN/Creatinine Ratio: 14 (ref 12–28)
BUN: 13 mg/dL (ref 8–27)
Bilirubin Total: 0.5 mg/dL (ref 0.0–1.2)
CO2: 21 mmol/L (ref 20–29)
Calcium: 9.8 mg/dL (ref 8.7–10.3)
Chloride: 98 mmol/L (ref 96–106)
Creatinine, Ser: 0.9 mg/dL (ref 0.57–1.00)
Globulin, Total: 3.4 g/dL (ref 1.5–4.5)
Glucose: 95 mg/dL (ref 70–99)
Potassium: 4.2 mmol/L (ref 3.5–5.2)
Sodium: 137 mmol/L (ref 134–144)
Total Protein: 7.8 g/dL (ref 6.0–8.5)
eGFR: 73 mL/min/1.73 (ref >59)

## 2023-08-04 LAB — LYME DISEASE, WESTERN BLOT
IgG P18 Ab.: ABSENT
IgG P23 Ab.: ABSENT
IgG P28 Ab.: ABSENT
IgG P30 Ab.: ABSENT
IgG P39 Ab.: ABSENT
IgG P41 Ab.: ABSENT
IgG P45 Ab.: ABSENT
IgG P58 Ab.: ABSENT
IgG P66 Ab.: ABSENT
IgG P93 Ab.: ABSENT
IgM P23 Ab.: ABSENT
IgM P39 Ab.: ABSENT
IgM P41 Ab.: ABSENT
Lyme IgG Wb: NEGATIVE
Lyme IgM Wb: NEGATIVE

## 2023-08-04 LAB — RHEUMATOID FACTOR: Rheumatoid fact SerPl-aCnc: 348.7 [IU]/mL — ABNORMAL HIGH (ref <14.0)

## 2023-08-04 LAB — CBC WITH DIFFERENTIAL/PLATELET
Basophils Absolute: 0 x10E3/uL (ref 0.0–0.2)
Basos: 1 %
EOS (ABSOLUTE): 0.2 x10E3/uL (ref 0.0–0.4)
Eos: 3 %
Hematocrit: 38.9 % (ref 34.0–46.6)
Hemoglobin: 12.2 g/dL (ref 11.1–15.9)
Immature Grans (Abs): 0 x10E3/uL (ref 0.0–0.1)
Immature Granulocytes: 0 %
Lymphocytes Absolute: 0.9 x10E3/uL (ref 0.7–3.1)
Lymphs: 20 %
MCH: 26 pg — ABNORMAL LOW (ref 26.6–33.0)
MCHC: 31.4 g/dL — ABNORMAL LOW (ref 31.5–35.7)
MCV: 83 fL (ref 79–97)
Monocytes Absolute: 0.3 x10E3/uL (ref 0.1–0.9)
Monocytes: 7 %
Neutrophils Absolute: 3.2 x10E3/uL (ref 1.4–7.0)
Neutrophils: 69 %
Platelets: 506 x10E3/uL — ABNORMAL HIGH (ref 150–450)
RBC: 4.7 x10E6/uL (ref 3.77–5.28)
RDW: 12.9 % (ref 11.7–15.4)
WBC: 4.7 x10E3/uL (ref 3.4–10.8)

## 2023-08-04 LAB — LIPID PANEL
Chol/HDL Ratio: 3 ratio (ref 0.0–4.4)
Cholesterol, Total: 179 mg/dL (ref 100–199)
HDL: 60 mg/dL (ref >39)
LDL Chol Calc (NIH): 106 mg/dL — ABNORMAL HIGH (ref 0–99)
Triglycerides: 68 mg/dL (ref 0–149)
VLDL Cholesterol Cal: 13 mg/dL (ref 5–40)

## 2023-08-04 LAB — SEDIMENTATION RATE: Sed Rate: 77 mm/h — ABNORMAL HIGH (ref 0–40)

## 2023-08-04 LAB — C-REACTIVE PROTEIN: CRP: 40 mg/L — ABNORMAL HIGH (ref 0–10)

## 2023-08-04 LAB — TSH: TSH: 2.7 u[IU]/mL (ref 0.450–4.500)

## 2023-08-04 LAB — HEMOGLOBIN A1C
Est. average glucose Bld gHb Est-mCnc: 123 mg/dL
Hgb A1c MFr Bld: 5.9 % — ABNORMAL HIGH (ref 4.8–5.6)

## 2023-08-04 LAB — CYCLIC CITRUL PEPTIDE ANTIBODY, IGG/IGA: Cyclic Citrullin Peptide Ab: >250 U — ABNORMAL HIGH (ref 0–19)

## 2023-08-04 LAB — ANA W/REFLEX IF POSITIVE: Anti Nuclear Antibody (ANA): NEGATIVE

## 2023-08-06 ENCOUNTER — Ambulatory Visit: Admitting: Family Medicine

## 2023-08-07 ENCOUNTER — Ambulatory Visit: Payer: Self-pay | Admitting: Family Medicine

## 2023-08-09 ENCOUNTER — Other Ambulatory Visit: Payer: Self-pay | Admitting: Podiatry

## 2023-08-09 ENCOUNTER — Encounter: Payer: Self-pay | Admitting: Podiatry

## 2023-08-13 ENCOUNTER — Ambulatory Visit: Payer: Self-pay

## 2023-08-13 NOTE — Anesthesia Preprocedure Evaluation (Addendum)
 Anesthesia Evaluation  Patient identified by MRN, date of birth, ID band Patient awake    Reviewed: Allergy & Precautions, H&P , NPO status , Patient's Chart, lab work & pertinent test results  Airway Mallampati: II  TM Distance: >3 FB     Dental no notable dental hx. (+) Teeth Intact   Pulmonary former smoker          Cardiovascular hypertension,      Neuro/Psych negative neurological ROS  negative psych ROS   GI/Hepatic negative GI ROS, Neg liver ROS,,,  Endo/Other  negative endocrine ROS    Renal/GU negative Renal ROS  negative genitourinary   Musculoskeletal negative musculoskeletal ROS (+) Arthritis ,    Abdominal   Peds negative pediatric ROS (+)  Hematology negative hematology ROS (+) Blood dyscrasia, anemia   Anesthesia Other Findings HTN Allergies Arthritis Per-diabetes   Reproductive/Obstetrics negative OB ROS                              Anesthesia Physical Anesthesia Plan  ASA: 2  Anesthesia Plan:    Post-op Pain Management:    Induction: Intravenous  PONV Risk Score and Plan:   Airway Management Planned: LMA  Additional Equipment:   Intra-op Plan:   Post-operative Plan: Extubation in OR  Informed Consent: I have reviewed the patients History and Physical, chart, labs and discussed the procedure including the risks, benefits and alternatives for the proposed anesthesia with the patient or authorized representative who has indicated his/her understanding and acceptance.     Dental Advisory Given  Plan Discussed with: Anesthesiologist, CRNA and Surgeon  Anesthesia Plan Comments: (Patient consented for risks of anesthesia including but not limited to:  - adverse reactions to medications - damage to eyes, teeth, lips or other oral mucosa - nerve damage due to positioning  - sore throat or hoarseness - Damage to heart, brain, nerves, lungs, other parts of  body or loss of life  Patient voiced understanding and assent.)         Anesthesia Quick Evaluation

## 2023-08-13 NOTE — Telephone Encounter (Signed)
 FYI Only or Action Required?: Action required by provider: medication refill request and referral request.  Patient was last seen in primary care on 08/02/2023 by Wellington Curtis LABOR, FNP.  Called Nurse Triage reporting Pain.  Symptoms began several months ago.  Interventions attempted: Rest, hydration, or home remedies.  Symptoms are: gradually worsening.  Triage Disposition: See PCP Within 2 Weeks  Patient/caregiver understands and will follow disposition?: No, wishes to speak with PCP  Copied from CRM #[id number]. Topic: Clinical - Red Word Triage >> Aug 13, 2023  8:14 AM Ivette P wrote: [facility] that prompted transfer to Nurse Triage: in  pain has rhuemetoid arthritis, see someone else for referral or prescribe pain medication.   8 or 9 pain level. unable to move middle finger over a month. starting have joint damage. Reason for Disposition  Finger pain is a chronic symptom (recurrent or ongoing AND present > 4 weeks)  Answer Assessment - Initial Assessment Questions 1. ONSET: When did the pain start?      ongoing 2. LOCATION and RADIATION: Where is the pain located?  (e.g., fingertip, around nail, joint, entire      Middle finger, L hand 3. SEVERITY: How bad is the pain? What does it keep you from doing?   (Scale 1-10; or mild, moderate, severe)     9 4. APPEARANCE: What does the finger look like? (e.g., redness, swelling, bruising, pallor)     Swollen x1 month 6. CAUSE: What do you think is causing the pain?     RA 7. AGGRAVATING FACTORS: What makes the pain worse? (e.g., using computer)     movement 8. OTHER SYMPTOMS: Do you have any other symptoms? (e.g., fever, neck pain, numbness)     Swelling at joints  Pt would like to be started on RA medication as the rheumatologist won't call to schedule for at least another 3 weeks. Pt is wanting a different rheum referral or an rx for methotrexate .  Protocols used: Finger Pain-A-AH

## 2023-08-13 NOTE — Telephone Encounter (Signed)
 Please offer appt with me. I would like to discuss options and treat. Can be in person or virtual

## 2023-08-20 ENCOUNTER — Ambulatory Visit (INDEPENDENT_AMBULATORY_CARE_PROVIDER_SITE_OTHER): Admitting: Family Medicine

## 2023-08-20 ENCOUNTER — Encounter: Payer: Self-pay | Admitting: Family Medicine

## 2023-08-20 VITALS — BP 134/87 | HR 95 | Ht 63.0 in | Wt 139.0 lb

## 2023-08-20 DIAGNOSIS — M0579 Rheumatoid arthritis with rheumatoid factor of multiple sites without organ or systems involvement: Secondary | ICD-10-CM | POA: Diagnosis not present

## 2023-08-20 DIAGNOSIS — M2550 Pain in unspecified joint: Secondary | ICD-10-CM | POA: Diagnosis not present

## 2023-08-20 MED ORDER — METHOTREXATE SODIUM 7.5 MG PO TABS: 7.5000 mg | ORAL_TABLET | ORAL | 0 refills | Status: DC

## 2023-08-20 NOTE — Progress Notes (Signed)
 Established patient visit   Patient: Sylvia Beasley   DOB: 02-04-1965   58 y.o. Female  MRN: [id number] Visit Date: 08/20/2023  Today's healthcare provider: Jon Eva, MD   Chief Complaint  Patient presents with   Medication     Discuss possible starting methotrexate    Subjective    HPI HPI     Medication     Additional comments: Discuss possible starting methotrexate       Last edited by Thelbert Eulalio HERO, CMA on 08/20/2023 10:07 AM.       Discussed the use of AI scribe software for clinical note transcription with the patient, who gave verbal consent to proceed.  History of Present Illness   Sylvia Beasley is a 58 year old female with rheumatoid arthritis who presents with worsening joint pain.  She experiences severe joint pain affecting her fingers, ankles, and knees, which impairs her ability to perform daily activities such as dressing and walking. Prednisone  provided temporary relief, but symptoms returned after discontinuation. Meloxicam  at 7.5 mg is ineffective, while over-the-counter Advil offers more relief despite its shorter duration. She is currently taking Tylenol  for pain management. Her cousin has similar joint issues and is on methotrexate , which she is interested in trying. She is unable to see a rheumatologist until the end of next month and is concerned about managing her symptoms until then. She is scheduled for surgery in two days. She takes folic acid  daily and has an allergy to Vioxx, which caused throat swelling, but has not had similar reactions to ibuprofen or meloxicam .         Medications: Outpatient Medications Prior to Visit  Medication Sig   ACCU-CHEK GUIDE TEST test strip as directed.   BLACK COHOSH COMPOUND PO Take 1 tablet by mouth at bedtime.   Cats Claw, Uncaria Tomentosa, (CATS CLAW PO) Take 1 tablet by mouth daily.   Folic Acid  (FOLATE PO) Take 1 tablet by mouth daily.   losartan  (COZAAR ) 50 MG tablet TAKE 1 TABLET BY MOUTH  EVERY DAY   metoprolol  succinate (TOPROL -XL) 25 MG 24 hr tablet TAKE 1 TABLET (25 MG TOTAL) BY MOUTH DAILY.   pyridOXINE (VITAMIN B6) 25 MG tablet Take 25 mg by mouth daily.   triamcinolone cream (KENALOG) 0.5 % Apply topically.   [DISCONTINUED] meloxicam  (MOBIC ) 7.5 MG tablet Take 1 tablet (7.5 mg total) by mouth daily. TAKE AFTER steroid pack is finished.   No facility-administered medications prior to visit.    Review of Systems     Objective    BP 134/87   Pulse 95   Ht 5' 3 (1.6 m)   Wt 139 lb (63 kg)   LMP 05/22/2015   SpO2 99%   BMI 24.62 kg/m    Physical Exam Vitals reviewed.  Constitutional:      General: She is not in acute distress.    Appearance: She is well-developed.  HENT:     Head: Normocephalic and atraumatic.  Eyes:     General: No scleral icterus.    Conjunctiva/sclera: Conjunctivae normal.  Cardiovascular:     Rate and Rhythm: Normal rate and regular rhythm.  Pulmonary:     Effort: Pulmonary effort is normal. No respiratory distress.  Musculoskeletal:        General: Swelling and tenderness present.  Skin:    General: Skin is warm and dry.     Findings: No rash.  Neurological:     Mental Status: She is alert and oriented to  person, place, and time.  Psychiatric:        Behavior: Behavior normal.      No results found for any visits on 08/20/23.  Assessment & Plan     Problem List Items Addressed This Visit       Other   Arthralgia   Other Visit Diagnoses       Rheumatoid arthritis involving multiple sites with positive rheumatoid factor (HCC)    -  Primary   Relevant Medications   methotrexate  (RHEUMATREX) 7.5 MG tablet   Other Relevant Orders   HIV antibody (with reflex)   Comprehensive metabolic panel with GFR   CBC w/Diff/Platelet   Hepatitis C Antibody   Hepatitis A Ab, Total   Hepatitis B Surface AntiBODY   Hepatitis B Core Antibody, total   Hepatitis B Surface AntiGEN   QuantiFERON-TB Gold Plus            Rheumatoid arthritis Rheumatoid arthritis confirmed by labs, currently in a flare state with significant joint pain affecting function, including difficulty bending fingers and walking. Previous treatment with prednisone  provided temporary relief, but meloxicam  at a low dose was ineffective. Upcoming surgery in two days requires careful management to avoid blood thinners or immune suppressants. Methotrexate  considered for interim management until rheumatology appointment on September 24, 2023 and then will turn management over to Rheum as this is not typically done in primary care. Discussed risks of methotrexate , including immune suppression, potential for nausea, and need for folic acid  supplementation. Emphasized importance of infectious disease screening prior to methotrexate  initiation. Discussed potential need for lifelong management of rheumatoid arthritis with medications. - Order infectious disease labs (TB, HIV, hepatitis) prior to methotrexate  initiation. - already vaccinated against Shingles - no live vaccines after starting methotrexate  but encourage flu vaccination. - Increase meloxicam  dose to 15 mg post-surgery, but not to be used concurrently with methotrexate . - Prescribe methotrexate  7.5 mg once weekly, to start two days post-surgery, with daily folic acid  supplementation. - Advise against use of meloxicam  or other NSAIDs concurrently with methotrexate  due to risk of increased methotrexate  concentration. - Discuss potential side effects of methotrexate , including nausea and immune suppression. - Advise hydration to prevent dehydration associated with methotrexate -induced nausea. - Schedule follow-up with rheumatology on September 24, 2023. - Advise on use of acetaminophen  for pain management pre-surgery.       Return for as scheduled.       Jon Eva, MD  [facility] [phone] (phone) [phone] (fax)  [facility]

## 2023-08-21 NOTE — Discharge Instructions (Signed)
 [facility]  POST OPERATIVE INSTRUCTIONS FOR DR. ASHLEY AND DR. BAKER [facility] PODIATRY DEPARTMENT   Take your medication as prescribed.  Pain medication should be taken only as needed.  Keep the dressing clean, dry and intact.  Keep your foot elevated above the heart level for the first 48 hours.  Walking to the bathroom and brief periods of walking are acceptable, unless we have instructed you to be non-weight bearing.  Always wear your post-op shoe when walking.  Always use your crutches if you are to be non-weight bearing.  Do not take a shower. Baths are permissible as long as the foot is kept out of the water.   Every hour you are awake:  Bend your knee 15 times. Flex foot 15 times Massage calf 15 times  Call [facility] [phone]) if any of the following problems occur: You develop a temperature or fever. The bandage becomes saturated with blood. Medication does not stop your pain. Injury of the foot occurs. Any symptoms of infection including redness, odor, or red streaks running from wound.

## 2023-08-22 ENCOUNTER — Other Ambulatory Visit: Payer: Self-pay

## 2023-08-22 ENCOUNTER — Encounter: Payer: Self-pay | Admitting: Podiatry

## 2023-08-22 ENCOUNTER — Ambulatory Visit: Payer: Self-pay

## 2023-08-22 ENCOUNTER — Ambulatory Visit: Payer: Self-pay | Admitting: Anesthesiology

## 2023-08-22 ENCOUNTER — Ambulatory Visit
Admission: RE | Admit: 2023-08-22 | Discharge: 2023-08-22 | Disposition: A | Discharge status: Home / Self Care | Attending: Podiatry | Admitting: Podiatry

## 2023-08-22 ENCOUNTER — Encounter
Admission: RE | Disposition: A | Discharge status: Home / Self Care | Payer: Self-pay | Source: Home / Self Care | Attending: Podiatry

## 2023-08-22 DIAGNOSIS — M2012 Hallux valgus (acquired), left foot: Secondary | ICD-10-CM | POA: Diagnosis present

## 2023-08-22 DIAGNOSIS — Z87891 Personal history of nicotine dependence: Secondary | ICD-10-CM | POA: Insufficient documentation

## 2023-08-22 DIAGNOSIS — M069 Rheumatoid arthritis, unspecified: Secondary | ICD-10-CM | POA: Diagnosis not present

## 2023-08-22 DIAGNOSIS — I10 Essential (primary) hypertension: Secondary | ICD-10-CM | POA: Diagnosis not present

## 2023-08-22 HISTORY — DX: Prediabetes: R73.03

## 2023-08-22 HISTORY — DX: Unspecified osteoarthritis, unspecified site: M19.90

## 2023-08-22 HISTORY — PX: HALLUX VALGUS AKIN: SHX6622

## 2023-08-22 HISTORY — PX: FOOT SURGERY: SHX648

## 2023-08-22 HISTORY — PX: HALLUX VALGUS LAPIDUS: SHX6626

## 2023-08-22 LAB — CBC WITH DIFFERENTIAL/PLATELET
Basophils Absolute: 0 x10E3/uL (ref 0.0–0.2)
Basos: 0 %
EOS (ABSOLUTE): 0.1 x10E3/uL (ref 0.0–0.4)
Eos: 2 %
Hematocrit: 36.5 % (ref 34.0–46.6)
Hemoglobin: 11.4 g/dL (ref 11.1–15.9)
Immature Grans (Abs): 0 x10E3/uL (ref 0.0–0.1)
Immature Granulocytes: 0 %
Lymphocytes Absolute: 1 x10E3/uL (ref 0.7–3.1)
Lymphs: 17 %
MCH: 26.3 pg — ABNORMAL LOW (ref 26.6–33.0)
MCHC: 31.2 g/dL — ABNORMAL LOW (ref 31.5–35.7)
MCV: 84 fL (ref 79–97)
Monocytes Absolute: 0.3 x10E3/uL (ref 0.1–0.9)
Monocytes: 6 %
Neutrophils Absolute: 4.1 x10E3/uL (ref 1.4–7.0)
Neutrophils: 75 %
Platelets: 415 x10E3/uL (ref 150–450)
RBC: 4.33 x10E6/uL (ref 3.77–5.28)
RDW: 13.2 % (ref 11.7–15.4)
WBC: 5.6 x10E3/uL (ref 3.4–10.8)

## 2023-08-22 LAB — COMPREHENSIVE METABOLIC PANEL WITH GFR
ALT: 21 IU/L (ref 0–32)
AST: 22 IU/L (ref 0–40)
Albumin: 4.4 g/dL (ref 3.8–4.9)
Alkaline Phosphatase: 98 IU/L (ref 44–121)
BUN/Creatinine Ratio: 13 (ref 12–28)
BUN: 11 mg/dL (ref 8–27)
Bilirubin Total: 0.3 mg/dL (ref 0.0–1.2)
CO2: 24 mmol/L (ref 20–29)
Calcium: 9.8 mg/dL (ref 8.7–10.3)
Chloride: 100 mmol/L (ref 96–106)
Creatinine, Ser: 0.82 mg/dL (ref 0.57–1.00)
Globulin, Total: 3 g/dL (ref 1.5–4.5)
Glucose: 95 mg/dL (ref 70–99)
Potassium: 4.1 mmol/L (ref 3.5–5.2)
Sodium: 139 mmol/L (ref 134–144)
Total Protein: 7.4 g/dL (ref 6.0–8.5)
eGFR: 82 mL/min/1.73 (ref >59)

## 2023-08-22 LAB — QUANTIFERON-TB GOLD PLUS
QuantiFERON Mitogen Value: 1.75 [IU]/mL
QuantiFERON Nil Value: 0.02 [IU]/mL
QuantiFERON TB1 Ag Value: 0.03 [IU]/mL
QuantiFERON TB2 Ag Value: 0.03 [IU]/mL

## 2023-08-22 LAB — HEPATITIS C ANTIBODY: Hep C Virus Ab: NONREACTIVE

## 2023-08-22 LAB — HEPATITIS B SURFACE ANTIBODY,QUALITATIVE

## 2023-08-22 LAB — HEPATITIS A ANTIBODY, TOTAL: hep A Total Ab: NEGATIVE

## 2023-08-22 LAB — HEPATITIS B SURFACE ANTIGEN: Hepatitis B Surface Ag: NEGATIVE

## 2023-08-22 LAB — HEPATITIS B CORE ANTIBODY, TOTAL: Hep B Core Total Ab: NEGATIVE

## 2023-08-22 LAB — HIV ANTIBODY (ROUTINE TESTING W REFLEX): HIV Screen 4th Generation wRfx: NONREACTIVE

## 2023-08-22 SURGERY — BUNIONECTOMY, LAPIDUS
Anesthesia: Choice | Laterality: Left

## 2023-08-22 MED ORDER — OXYCODONE-ACETAMINOPHEN 5-325 MG PO TABS: 1.0000 | ORAL_TABLET | Freq: Four times a day (QID) | ORAL | 0 refills | Status: DC | PRN

## 2023-08-22 MED ORDER — CEFAZOLIN SODIUM-DEXTROSE 2-3 GM-%(50ML) IV SOLR
INTRAVENOUS | Status: AC
Start: 2023-08-22 — End: 2023-08-22
  Filled 2023-08-22: qty 50

## 2023-08-22 MED ORDER — MIDAZOLAM HCL 5 MG/5ML IJ SOLN
INTRAMUSCULAR | Status: DC | PRN
Start: 2023-08-22 — End: 2023-08-22
  Administered 2023-08-22: 2 mg via INTRAVENOUS

## 2023-08-22 MED ORDER — ONDANSETRON HCL 4 MG/2ML IJ SOLN
INTRAMUSCULAR | Status: DC | PRN
  Administered 2023-08-22: 4 mg via INTRAVENOUS

## 2023-08-22 MED ORDER — FENTANYL CITRATE (PF) 100 MCG/2ML IJ SOLN
INTRAMUSCULAR | Status: DC | PRN
  Administered 2023-08-22 (×2): 50 ug via INTRAVENOUS

## 2023-08-22 MED ORDER — FENTANYL CITRATE (PF) 100 MCG/2ML IJ SOLN
INTRAMUSCULAR | Status: AC
Start: 2023-08-22 — End: 2023-08-22
  Filled 2023-08-22: qty 2

## 2023-08-22 MED ORDER — DEXAMETHASONE SODIUM PHOSPHATE 4 MG/ML IJ SOLN
INTRAMUSCULAR | Status: DC | PRN
  Administered 2023-08-22: 4 mg via INTRAVENOUS

## 2023-08-22 MED ORDER — GLYCOPYRROLATE 0.2 MG/ML IJ SOLN
INTRAMUSCULAR | Status: DC | PRN
  Administered 2023-08-22: .2 mg via INTRAVENOUS

## 2023-08-22 MED ORDER — LIDOCAINE HCL (CARDIAC) PF 100 MG/5ML IV SOSY
PREFILLED_SYRINGE | INTRAVENOUS | Status: DC | PRN
  Administered 2023-08-22: 60 mg via INTRATRACHEAL

## 2023-08-22 MED ORDER — ASPIRIN 81 MG PO TBEC: 81.0000 mg | DELAYED_RELEASE_TABLET | Freq: Two times a day (BID) | ORAL | 12 refills | Status: DC

## 2023-08-22 MED ORDER — BUPIVACAINE HCL (PF) 0.25 % IJ SOLN
INTRAMUSCULAR | Status: DC | PRN
  Administered 2023-08-22: 10 mL

## 2023-08-22 MED ORDER — PHENYLEPHRINE HCL (PRESSORS) 10 MG/ML IV SOLN
INTRAVENOUS | Status: DC | PRN
Start: 2023-08-22 — End: 2023-08-22
  Administered 2023-08-22 (×3): 100 ug via INTRAVENOUS

## 2023-08-22 MED ORDER — LACTATED RINGERS IV SOLN: INTRAVENOUS | Status: DC

## 2023-08-22 MED ORDER — SODIUM CHLORIDE 0.9 % IR SOLN
Status: DC | PRN
  Administered 2023-08-22: 1

## 2023-08-22 MED ORDER — MIDAZOLAM HCL 2 MG/2ML IJ SOLN
INTRAMUSCULAR | Status: AC
Start: 2023-08-22 — End: 2023-08-22
  Filled 2023-08-22: qty 2

## 2023-08-22 MED ORDER — PROPOFOL 10 MG/ML IV BOLUS
INTRAVENOUS | Status: DC | PRN
  Administered 2023-08-22: 200 mg via INTRAVENOUS

## 2023-08-22 MED ORDER — CEFAZOLIN SODIUM-DEXTROSE 2-4 GM/100ML-% IV SOLN
2.0000 g | INTRAVENOUS | Status: AC
  Administered 2023-08-22: 2 g via INTRAVENOUS

## 2023-08-22 MED ORDER — BUPIVACAINE LIPOSOME 1.3 % IJ SUSP
INTRAMUSCULAR | Status: AC
  Filled 2023-08-22: qty 10

## 2023-08-22 MED ORDER — BUPIVACAINE LIPOSOME 1.3 % IJ SUSP
INTRAMUSCULAR | Status: DC | PRN
Start: 2023-08-22 — End: 2023-08-22
  Administered 2023-08-22: 10 mL

## 2023-08-22 SURGICAL SUPPLY — 35 items
BENZOIN TINCTURE PRP APPL 2/3 (GAUZE/BANDAGES/DRESSINGS) ×1 IMPLANT
BLADE SAW LAPIPLASTY 40X11 (BLADE) IMPLANT
BLADE SURG 15 STRL LF DISP TIS (BLADE) IMPLANT
BNDG COHESIVE 4X5 TAN STRL LF (GAUZE/BANDAGES/DRESSINGS) ×1 IMPLANT
BNDG ELASTIC 4X5.8 VLCR NS LF (GAUZE/BANDAGES/DRESSINGS) ×1 IMPLANT
BNDG ESMARCH 4X12 STRL LF (GAUZE/BANDAGES/DRESSINGS) ×1 IMPLANT
BNDG GAUZE DERMACEA FLUFF 4 (GAUZE/BANDAGES/DRESSINGS) ×1 IMPLANT
BNDG STRETCH 4X75 STRL LF (GAUZE/BANDAGES/DRESSINGS) ×1 IMPLANT
BOOT STEPPER DURA MED (SOFTGOODS) IMPLANT
CANISTER SUCT 1200ML W/VALVE (MISCELLANEOUS) ×1 IMPLANT
CHLORAPREP W/TINT 26 (MISCELLANEOUS) IMPLANT
COVER LIGHT HANDLE UNIVERSAL (MISCELLANEOUS) ×2 IMPLANT
DRAPE FLUOR MINI C-ARM 54X84 (DRAPES) ×1 IMPLANT
ELECTRODE REM PT RTRN 9FT ADLT (ELECTROSURGICAL) ×1 IMPLANT
GAUZE SPONGE 4X4 12PLY STRL (GAUZE/BANDAGES/DRESSINGS) ×1 IMPLANT
GAUZE XEROFORM 1X8 LF (GAUZE/BANDAGES/DRESSINGS) ×1 IMPLANT
GLOVE BIOGEL PI IND STRL 8 (GLOVE) ×2 IMPLANT
GLOVE SURG SS PI 7.5 STRL IVOR (GLOVE) ×2 IMPLANT
GOWN SPEC L4 XLG W/TWL (GOWN DISPOSABLE) ×1 IMPLANT
GOWN STRL REUS W/ TWL LRG LVL3 (GOWN DISPOSABLE) ×1 IMPLANT
KIT TURNOVER KIT A (KITS) ×1 IMPLANT
NS IRRIG 500ML POUR BTL (IV SOLUTION) ×1 IMPLANT
PACK EXTREMITY ARMC (MISCELLANEOUS) ×1 IMPLANT
PENCIL SMOKE EVACUATOR (MISCELLANEOUS) ×1 IMPLANT
RASP SM TEAR CROSS CUT (RASP) IMPLANT
SCREW 2.7 HIGH PITCH LOCKING (Screw) IMPLANT
SCREW HIGH PITCH LOCK 2.7 (Screw) IMPLANT
STOCKINETTE IMPERVIOUS LG (DRAPES) ×1 IMPLANT
STRIP CLOSURE SKIN 1/2X4 (GAUZE/BANDAGES/DRESSINGS) IMPLANT
STRIP CLOSURE SKIN 1/4X4 (GAUZE/BANDAGES/DRESSINGS) ×1 IMPLANT
SUT ETHILON 3-0 (SUTURE) IMPLANT
SUT VIC AB 3-0 SH 27X BRD (SUTURE) IMPLANT
SUT VIC AB 4-0 SH 27XANBCTRL (SUTURE) IMPLANT
SUTURE MNCRL 4-0 27XMF (SUTURE) ×1 IMPLANT
SYSTEM LAPIPLASTY 4A (Orthopedic Implant) IMPLANT

## 2023-08-22 NOTE — Anesthesia Postprocedure Evaluation (Signed)
 Anesthesia Post Note  Patient: Sylvia Beasley  Procedure(s) Performed: ROMAYNE LOOK (Left) CORRECTION, HALLUX VALGUS, WITH AKIN OSTEOTOMY (Left)  Patient location during evaluation: PACU Anesthesia Type: General Level of consciousness: awake and alert Pain management: pain level controlled Vital Signs Assessment: post-procedure vital signs reviewed and stable Respiratory status: spontaneous breathing, nonlabored ventilation, respiratory function stable and patient connected to nasal cannula oxygen Cardiovascular status: blood pressure returned to baseline and stable Postop Assessment: no apparent nausea or vomiting Anesthetic complications: no   No notable events documented.   Last Vitals:  Vitals:   08/22/23 1330 08/22/23 1345  BP: (!) 150/89 (!) 165/92  Pulse: 87 82  Resp: 15 11  Temp:  36.4 C  SpO2: 100% 100%    Last Pain:  Vitals:   08/22/23 1345  TempSrc:   PainSc: 0-No pain                 Adhrit Krenz C Leverett Camplin

## 2023-08-22 NOTE — Anesthesia Procedure Notes (Signed)
 Procedure Name: LMA Insertion Date/Time: 08/22/2023 12:01 PM  Performed by: Levy Harvey, CRNAPre-anesthesia Checklist: Patient identified, Emergency Drugs available, Suction available, Timeout performed and Patient being monitored Patient Re-evaluated:Patient Re-evaluated prior to induction Oxygen Delivery Method: Circle system utilized Preoxygenation: Pre-oxygenation with 100% oxygen Induction Type: IV induction LMA: LMA inserted LMA Size: 4.0 Number of attempts: 1 Placement Confirmation: positive ETCO2 and breath sounds checked- equal and bilateral Tube secured with: Tape

## 2023-08-22 NOTE — Transfer of Care (Signed)
 Immediate Anesthesia Transfer of Care Note  Patient: Sylvia Beasley  Procedure(s) Performed: ROMAYNE LOOK (Left) CORRECTION, HALLUX VALGUS, WITH AKIN OSTEOTOMY (Left)  Patient Location: PACU  Anesthesia Type: No value filed.  Level of Consciousness: awake, alert  and patient cooperative  Airway and Oxygen Therapy: Patient Spontanous Breathing and Patient connected to supplemental oxygen  Post-op Assessment: Post-op Vital signs reviewed, Patient's Cardiovascular Status Stable, Respiratory Function Stable, Patent Airway and No signs of Nausea or vomiting  Post-op Vital Signs: Reviewed and stable  Complications: No notable events documented.

## 2023-08-22 NOTE — Op Note (Signed)
 Operative note   Surgeon:Piotr Christopher Armed forces logistics/support/administrative officer: None    Preop diagnosis: 1)Hallux valgus deformity left foot.    Postop diagnosis: Same    Procedure: 1)Lapidus hallux valgus correction left foot.  2) intraoperative fluoroscopy without assistance of radiologist    EBL: Minimal    Anesthesia:local and general.  Local consist of a total of 20 cc of one-to-one mixture of 0.25% plain bupivacaine  and Exparel  long-acting anesthetic    Hemostasis: Mid calf tourniquet inflated to 200 mmHg for 62 minutes    Specimen: None    Complications: None    Operative indications:Sylvia Beasley is an 58 y.o. that presents today for surgical intervention.  The risks/benefits/alternatives/complications have been discussed and consent has been given.    Procedure:  Patient was brought into the OR and placed on the operating table in thesupine position. After anesthesia was obtained theleft lower extremity was prepped and draped in usual sterile fashion.  Attention was then directed to the dorsomedial first MTPJ where an incision was performed.  Sharp and blunt dissection was carried down to the capsule.  The intermetatarsal space was then entered.  The conjoined tendon of the abductor was then freed from the base of the proximal phalanx.  Attention was directed to the dorsal aspect of the foot where a dorsal incision was made at the first met cuneiforms joint.  Sharp and blunt dissection was carried down to the periosteum.  Subperiosteal dissection was then undertaken.  This exposed the first met cuneiform joint.  This was then freed and loosened.  A small fulcrum was placed between the base of the first metatarsal and second metatarsal.  Next the joint positioner was placed on the medial aspect of the metatarsal.  A small stab incision was made at the second metatarsal.  Compression was placed for the positioner.  Good realignment of the first intermetatarsal angle was noted at this time.  At this time the  osteotomy cut guide was placed into the joint region.  2 vertical cuts were then made.  The cartilage material was removed from the first met cuneiform joint and the joint was then prepped with a 2.0 mm drill bit.  The joint compressor was then placed.  Good compression and realignment was noted.  Next the medial and dorsal locking plates were then placed from the lapiplasty set.  Realignment and stability was noted in all planes.  Attention was redirected to the dorsomedial first MTPJ.  A small T capsulotomy was performed.  The dorsomedial eminence was noted and transected and smoothed with a power rasp.  A small capsulorrhaphy was performed.  Closure was then performed after all areas were irrigated.  3-0 Vicryl for the capsular tissue.  4-0 Vicryl in subcutaneous tissue and a 4-0 Monocryl for the skin.   Further local anesthesia was performed at the end of the case.    Patient tolerated the procedure and anesthesia well.  Was transported from the OR to the PACU with all vital signs stable and vascular status intact. To be discharged per routine protocol.  Will follow up in approximately 1 week in the outpatient clinic.

## 2023-08-22 NOTE — H&P (Signed)
 HISTORY AND PHYSICAL INTERVAL NOTE:  08/22/2023  11:10 AM  Sylvia Beasley  has presented today for surgery, with the diagnosis of Hallux valgus of left foot.  The various methods of treatment have been discussed with the patient.  No guarantees were given.  After consideration of risks, benefits and other options for treatment, the patient has consented to surgery.  I have reviewed the patients' chart and labs.     A history and physical examination was performed in my office.  The patient was reexamined.  There have been no changes to this history and physical examination.  Ashley Soulier A

## 2023-08-23 ENCOUNTER — Ambulatory Visit: Payer: Self-pay | Admitting: *Deleted

## 2023-08-23 ENCOUNTER — Ambulatory Visit: Payer: Self-pay | Admitting: Family Medicine

## 2023-08-23 NOTE — Telephone Encounter (Signed)
 Noted

## 2023-08-23 NOTE — Telephone Encounter (Signed)
 FYI Only or Action Required?: FYI only for provider.  Patient was last seen in primary care on 08/20/2023 by Sylvia Jon HERO, MD.  Called Nurse Triage reporting Allergic Reaction.  Symptoms began today.  Interventions attempted: Prescription medications: Percocet.  Symptoms are: gradually worsening.  Triage Disposition: Call specialist now- patient advised call surgeon for replacement medication    Patient/caregiver understands and will follow disposition?: yes   Reason for Disposition  [1] Caller has URGENT medicine question about med that primary care doctor (or NP/PA) or specialist prescribed AND [2] triager unable to answer question  [1] Taking new prescription medication AND [2] rash within 4 hours of 1st dose  Answer Assessment - Initial Assessment Questions 1. NAME of MEDICINE: What medicine(s) are you calling about?     Percocet- reaction 2. QUESTION: What is your question? (e.g., double dose of medicine, side effect)     Patient would like a replacement medication  3. PRESCRIBER: Who prescribed the medicine? Reason: if prescribed by specialist, call should be referred to that group.     [facility]- surgeon  4. SYMPTOMS: Do you have any symptoms? If Yes, ask: What symptoms are you having?  How bad are the symptoms (e.g., mild, moderate, severe)     Rash on arms and legs- no other symptoms- no SOB, tongue swelling  Answer Assessment - Initial Assessment Questions 1. APPEARANCE of RASH: What does the rash look like? (e.g., spots, blisters, raised areas, skin peeling, scaly)     Red rash  3. LOCATION: Where is the rash located?     Arms and legs  5. ONSET: When did the rash begin?     Surgery yesterday- was given percocet  9. NEW MEDICINES: What new medicines are you taking? (e.g., name of antibiotic) When did you start taking this medication?.     Percocet  Protocols used: Medication Question Call-A-AH, Rash - Widespread On Drugs-A-AH Copied  from CRM #[id number]. Topic: Clinical - Red Word Triage >> Aug 23, 2023  9:17 AM Berwyn MATSU wrote: Red Word that prompted transfer to Nurse Triage: allergic reaction to percocet.

## 2023-08-31 ENCOUNTER — Other Ambulatory Visit: Payer: Self-pay | Admitting: Family Medicine

## 2023-08-31 DIAGNOSIS — M2550 Pain in unspecified joint: Secondary | ICD-10-CM

## 2023-09-11 ENCOUNTER — Encounter: Payer: Self-pay | Admitting: Family Medicine

## 2023-09-11 ENCOUNTER — Ambulatory Visit (INDEPENDENT_AMBULATORY_CARE_PROVIDER_SITE_OTHER): Admitting: Family Medicine

## 2023-09-11 VITALS — BP 128/80 | HR 89 | Wt 136.6 lb

## 2023-09-11 DIAGNOSIS — E782 Mixed hyperlipidemia: Secondary | ICD-10-CM

## 2023-09-11 DIAGNOSIS — R7303 Prediabetes: Secondary | ICD-10-CM

## 2023-09-11 DIAGNOSIS — M0579 Rheumatoid arthritis with rheumatoid factor of multiple sites without organ or systems involvement: Secondary | ICD-10-CM | POA: Insufficient documentation

## 2023-09-11 DIAGNOSIS — I10 Essential (primary) hypertension: Secondary | ICD-10-CM | POA: Diagnosis not present

## 2023-09-11 DIAGNOSIS — Z0000 Encounter for general adult medical examination without abnormal findings: Secondary | ICD-10-CM | POA: Diagnosis not present

## 2023-09-11 DIAGNOSIS — F5101 Primary insomnia: Secondary | ICD-10-CM | POA: Insufficient documentation

## 2023-09-11 MED ORDER — MELOXICAM 15 MG PO TABS: 15.0000 mg | ORAL_TABLET | Freq: Every day | ORAL | 1 refills | Status: AC

## 2023-09-11 MED ORDER — TRAZODONE HCL 50 MG PO TABS: 25.0000 mg | ORAL_TABLET | Freq: Every evening | ORAL | 3 refills | Status: DC | PRN

## 2023-09-11 NOTE — Assessment & Plan Note (Signed)
 Chronic insomnia with difficulty maintaining sleep, getting only two hours at a time. Pain from rheumatoid arthritis and recent surgery may contribute to sleep disturbances. Discussed trazodone  as a prescription option after trying supplements. - Prescribe trazodone  50 mg, instruct to take 30 minutes before bedtime, adjust dose as needed - Increase meloxicam  to 15 mg for pain management

## 2023-09-11 NOTE — Assessment & Plan Note (Signed)
 Prediabetes is well-managed with recent A1c

## 2023-09-11 NOTE — Assessment & Plan Note (Signed)
 Blood pressure initially elevated but normalized to 128/80 mmHg after rest. No changes to current management needed. - Recheck blood pressure before leaving - Continue current hypertension management - Schedule follow-up in six months

## 2023-09-11 NOTE — Progress Notes (Signed)
 Complete physical exam   Patient: Sylvia Beasley   DOB: 1965-05-06   58 y.o. Female  MRN: [id number] Visit Date: 09/11/2023  Today's healthcare provider: Jon Eva, MD   Chief Complaint  Patient presents with   Annual Exam    Last completed 08/18/22 Diet -  low sodium, low carb Exercise - none due to recent foot surgery Feeling - fairly well Sleeping - poorly Concerns - none   Immunizations    Pneumococcal - declined, reports receiving at the same time she got covid vaccine. Information is on covid card. Advised we need image   Subjective    Sylvia Beasley is a 58 y.o. female who presents today for a complete physical exam.   Discussed the use of AI scribe software for clinical note transcription with the patient, who gave verbal consent to proceed.  History of Present Illness   Sylvia Beasley is a 58 year old female who presents for an annual physical exam.  She has completed her pneumonia and shingles vaccinations and plans to provide documentation of her COVID vaccinations.  She manages rheumatoid arthritis with Moringa and turmeric and is scheduled to see rheumatology on September 22nd. She did not start methotrexate  due to concerns about post-surgical foot healing and potential hair loss.  She experiences insomnia, sleeping only about two hours at a time. Melatonin and magnesium glycinate have been ineffective, and she is not currently using prescription sleep medications.  Post foot surgery, she was initially prescribed oxycodone , which caused an allergic reaction, and was switched to another medication for the first week. She currently uses meloxicam  7.5 mg for pain management.  Recent lab work, including cholesterol, kidney and liver function, A1c, thyroid, and blood counts, showed stable results. She is current on cancer screenings, with a mammogram last month and her next colon cancer screening due in 2033.        Last depression screening scores    08/02/2023     8:30 AM 05/18/2023    1:27 PM 03/05/2023   11:14 AM  PHQ 2/9 Scores  PHQ - 2 Score 0 0 0  PHQ- 9 Score  1    Last fall risk screening    08/02/2023    8:30 AM  Fall Risk   Falls in the past year? 0  Number falls in past yr: 0  Injury with Fall? 0  Risk for fall due to : No Fall Risks        Medications: Outpatient Medications Prior to Visit  Medication Sig   ACCU-CHEK GUIDE TEST test strip as directed.   aspirin  EC 81 MG tablet Take 1 tablet (81 mg total) by mouth in the morning and at bedtime. Swallow whole.   BLACK COHOSH COMPOUND PO Take 1 tablet by mouth at bedtime.   Cats Claw, Uncaria Tomentosa, (CATS CLAW PO) Take 1 tablet by mouth daily.   Folic Acid  (FOLATE PO) Take 1 tablet by mouth daily.   losartan  (COZAAR ) 50 MG tablet TAKE 1 TABLET BY MOUTH EVERY DAY   metoprolol  succinate (TOPROL -XL) 25 MG 24 hr tablet TAKE 1 TABLET (25 MG TOTAL) BY MOUTH DAILY.   Moringa 500 MG CAPS Take 1,500 mg by mouth.   pyridOXINE (VITAMIN B6) 25 MG tablet Take 25 mg by mouth daily.   triamcinolone cream (KENALOG) 0.5 % Apply topically.   Turmeric (QC TUMERIC COMPLEX) 500 MG CAPS Take 1,000 mg by mouth.   [DISCONTINUED] methotrexate  (RHEUMATREX) 7.5 MG tablet Take 1 tablet (7.5  mg total) by mouth once a week. Caution:Chemotherapy. Protect from light.   [DISCONTINUED] oxyCODONE -acetaminophen  (PERCOCET) 5-325 MG tablet Take 1-2 tablets by mouth every 6 (six) hours as needed for severe pain (pain score 7-10). Max 6 tabs per day (Patient not taking: Reported on 09/11/2023)   No facility-administered medications prior to visit.    Review of Systems    Objective    BP 128/80 (BP Location: Left Arm, Patient Position: Sitting, Cuff Size: Normal)   Pulse 89   Wt 136 lb 9.6 oz (62 kg)   LMP 05/22/2015   SpO2 100%   BMI 24.20 kg/m    Physical Exam Vitals reviewed.  Constitutional:      General: She is not in acute distress.    Appearance: Normal appearance. She is well-developed.  She is not diaphoretic.  HENT:     Head: Normocephalic and atraumatic.     Right Ear: Tympanic membrane, ear canal and external ear normal.     Left Ear: Tympanic membrane, ear canal and external ear normal.     Nose: Nose normal.     Mouth/Throat:     Mouth: Mucous membranes are moist.     Pharynx: Oropharynx is clear. No oropharyngeal exudate.  Eyes:     General: No scleral icterus.    Conjunctiva/sclera: Conjunctivae normal.     Pupils: Pupils are equal, round, and reactive to light.  Neck:     Thyroid: No thyromegaly.  Cardiovascular:     Rate and Rhythm: Normal rate and regular rhythm.     Heart sounds: Normal heart sounds. No murmur heard. Pulmonary:     Effort: Pulmonary effort is normal. No respiratory distress.     Breath sounds: Normal breath sounds. No wheezing or rales.  Abdominal:     General: There is no distension.     Palpations: Abdomen is soft.     Tenderness: There is no abdominal tenderness.  Musculoskeletal:        General: No deformity.     Cervical back: Neck supple.     Right lower leg: No edema.     Left lower leg: No edema.  Lymphadenopathy:     Cervical: No cervical adenopathy.  Skin:    General: Skin is warm and dry.     Findings: No rash.  Neurological:     Mental Status: She is alert and oriented to person, place, and time. Mental status is at baseline.     Gait: Gait normal.  Psychiatric:        Mood and Affect: Mood normal.        Behavior: Behavior normal.        Thought Content: Thought content normal.      No results found for any visits on 09/11/23.  Assessment & Plan    Routine Health Maintenance and Physical Exam  Exercise Activities and Dietary recommendations  Goals   None     Immunization History  Administered Date(s) Administered   Moderna Sars-Covid-2 Vaccination 03/20/2019, 04/17/2019   PFIZER(Purple Top)SARS-COV-2 Vaccination 11/02/2019, 12/01/2020   Pfizer Covid-19 Vaccine Bivalent Booster 18yrs & up 12/01/2020    Pfizer(Comirnaty)Fall Seasonal Vaccine 12 years and older 10/18/2021   Td 06/19/2001   Tdap 08/18/2022   Zoster Recombinant(Shingrix) 04/02/2020, 06/02/2020    Health Maintenance  Topic Date Due   Pneumococcal Vaccine: 50+ Years (1 of 2 - PCV) Never done   COVID-19 Vaccine (6 - 2025-26 season) 09/03/2023   Influenza Vaccine  04/01/2024 (Originally 08/03/2023)  MAMMOGRAM  08/02/2025   Cervical Cancer Screening (HPV/Pap Cotest)  03/04/2028   Colonoscopy  09/23/2031   DTaP/Tdap/Td (3 - Td or Tdap) 08/17/2032   Hepatitis C Screening  Completed   HIV Screening  Completed   Zoster Vaccines- Shingrix  Completed   Hepatitis B Vaccines 19-59 Average Risk  Aged Out   HPV VACCINES  Aged Out   Meningococcal B Vaccine  Aged Out    Discussed health benefits of physical activity, and encouraged her to engage in regular exercise appropriate for her age and condition.  Problem List Items Addressed This Visit       Cardiovascular and Mediastinum   Primary hypertension   Blood pressure initially elevated but normalized to 128/80 mmHg after rest. No changes to current management needed. - Recheck blood pressure before leaving - Continue current hypertension management - Schedule follow-up in six months        Musculoskeletal and Integument   Rheumatoid arthritis involving multiple sites with positive rheumatoid factor (HCC)   Management complicated by recent foot surgery. Methotrexate  not started due to potential interference with surgical healing. Using Moringa and turmeric for symptom relief. Rheumatology appointment scheduled for September 22nd to discuss further management options. Discussed potential hair loss with methotrexate  and need to explore alternative treatments with rheumatology. - Continue Moringa and turmeric for symptom relief - Attend rheumatology appointment on September 22nd - Discuss rheumatoid arthritis management options with rheumatology      Relevant Medications    meloxicam  (MOBIC ) 15 MG tablet     Other   Moderate mixed hyperlipidemia not requiring statin therapy   Mixed hyperlipidemia is well-managed with recent labs showing cholesterol levels within normal limits.      Prediabetes   Prediabetes is well-managed with recent A1c      Primary insomnia   Chronic insomnia with difficulty maintaining sleep, getting only two hours at a time. Pain from rheumatoid arthritis and recent surgery may contribute to sleep disturbances. Discussed trazodone  as a prescription option after trying supplements. - Prescribe trazodone  50 mg, instruct to take 30 minutes before bedtime, adjust dose as needed - Increase meloxicam  to 15 mg for pain management      Other Visit Diagnoses       Encounter for annual physical exam    -  Primary           Adult Wellness Visit Routine adult wellness visit with vaccinations up to date, including pneumonia and shingles. Declined flu shot. Tetanus vaccination current until 2034. Cancer screenings current with recent mammogram and Pap smear. Colon cancer screening due in 2033. Recent labs including cholesterol, kidney and liver function, A1c, thyroid, and blood counts are within normal limits. - Update vaccination records with pneumonia shot documentation - Schedule next colon cancer screening for 2033 - Schedule next Pap smear for 2030 - Schedule follow-up in six months      Return in about 6 months (around 03/10/2024) for chronic disease f/u.     Jon Eva, MD  [facility] [phone] (phone) [phone] (fax)  [facility]

## 2023-09-11 NOTE — Assessment & Plan Note (Signed)
 Mixed hyperlipidemia is well-managed with recent labs showing cholesterol levels within normal limits.

## 2023-09-11 NOTE — Assessment & Plan Note (Signed)
 Management complicated by recent foot surgery. Methotrexate  not started due to potential interference with surgical healing. Using Moringa and turmeric for symptom relief. Rheumatology appointment scheduled for September 22nd to discuss further management options. Discussed potential hair loss with methotrexate  and need to explore alternative treatments with rheumatology. - Continue Moringa and turmeric for symptom relief - Attend rheumatology appointment on September 22nd - Discuss rheumatoid arthritis management options with rheumatology

## 2023-10-06 ENCOUNTER — Other Ambulatory Visit: Payer: Self-pay | Admitting: Family Medicine

## 2023-10-10 ENCOUNTER — Other Ambulatory Visit: Payer: Self-pay

## 2023-10-10 ENCOUNTER — Emergency Department (EMERGENCY_DEPARTMENT_HOSPITAL)
Admission: EM | Admit: 2023-10-10 | Discharge: 2023-10-11 | Disposition: A | Discharge status: Other Acute Inpatient Hospital | Attending: Emergency Medicine | Admitting: Emergency Medicine

## 2023-10-10 ENCOUNTER — Emergency Department

## 2023-10-10 DIAGNOSIS — G4700 Insomnia, unspecified: Secondary | ICD-10-CM | POA: Insufficient documentation

## 2023-10-10 DIAGNOSIS — Z87891 Personal history of nicotine dependence: Secondary | ICD-10-CM | POA: Insufficient documentation

## 2023-10-10 DIAGNOSIS — I10 Essential (primary) hypertension: Secondary | ICD-10-CM | POA: Insufficient documentation

## 2023-10-10 DIAGNOSIS — Z79899 Other long term (current) drug therapy: Secondary | ICD-10-CM | POA: Insufficient documentation

## 2023-10-10 DIAGNOSIS — R462 Strange and inexplicable behavior: Secondary | ICD-10-CM | POA: Insufficient documentation

## 2023-10-10 DIAGNOSIS — F22 Delusional disorders: Secondary | ICD-10-CM | POA: Diagnosis not present

## 2023-10-10 DIAGNOSIS — F29 Unspecified psychosis not due to a substance or known physiological condition: Secondary | ICD-10-CM | POA: Diagnosis not present

## 2023-10-10 DIAGNOSIS — N179 Acute kidney failure, unspecified: Secondary | ICD-10-CM | POA: Insufficient documentation

## 2023-10-10 LAB — COMPREHENSIVE METABOLIC PANEL WITH GFR
ALT: 30 U/L (ref 0–44)
AST: 45 U/L — ABNORMAL HIGH (ref 15–41)
Albumin: 4.4 g/dL (ref 3.5–5.0)
Alkaline Phosphatase: 95 U/L (ref 38–126)
Anion gap: 14 (ref 5–15)
BUN: 17 mg/dL (ref 6–20)
CO2: 24 mmol/L (ref 22–32)
Calcium: 9.8 mg/dL (ref 8.9–10.3)
Chloride: 99 mmol/L (ref 98–111)
Creatinine, Ser: 1.32 mg/dL — ABNORMAL HIGH (ref 0.44–1.00)
GFR, Estimated: 46 mL/min — ABNORMAL LOW (ref >60)
Glucose, Bld: 182 mg/dL — ABNORMAL HIGH (ref 70–99)
Potassium: 3.7 mmol/L (ref 3.5–5.1)
Sodium: 137 mmol/L (ref 135–145)
Total Bilirubin: 0.8 mg/dL (ref 0.0–1.2)
Total Protein: 9 g/dL — ABNORMAL HIGH (ref 6.5–8.1)

## 2023-10-10 LAB — URINE DRUG SCREEN, QUALITATIVE (ARMC ONLY)
Amphetamines, Ur Screen: NOT DETECTED
Barbiturates, Ur Screen: NOT DETECTED
Benzodiazepine, Ur Scrn: NOT DETECTED
Cannabinoid 50 Ng, Ur ~~LOC~~: NOT DETECTED
Cocaine Metabolite,Ur ~~LOC~~: NOT DETECTED
MDMA (Ecstasy)Ur Screen: NOT DETECTED
Methadone Scn, Ur: NOT DETECTED
Opiate, Ur Screen: NOT DETECTED
Phencyclidine (PCP) Ur S: NOT DETECTED
Tricyclic, Ur Screen: NOT DETECTED

## 2023-10-10 LAB — CBC
HCT: 32.9 % — ABNORMAL LOW (ref 36.0–46.0)
Hemoglobin: 10.3 g/dL — ABNORMAL LOW (ref 12.0–15.0)
MCH: 25.1 pg — ABNORMAL LOW (ref 26.0–34.0)
MCHC: 31.3 g/dL (ref 30.0–36.0)
MCV: 80.2 fL (ref 80.0–100.0)
Platelets: 473 K/uL — ABNORMAL HIGH (ref 150–400)
RBC: 4.1 MIL/uL (ref 3.87–5.11)
RDW: 14.6 % (ref 11.5–15.5)
WBC: 5.5 K/uL (ref 4.0–10.5)
nRBC: 0 % (ref 0.0–0.2)

## 2023-10-10 LAB — ETHANOL: Alcohol, Ethyl (B): <15 mg/dL (ref <15)

## 2023-10-10 LAB — URINALYSIS, COMPLETE (UACMP) WITH MICROSCOPIC
Bilirubin Urine: NEGATIVE
Glucose, UA: NEGATIVE mg/dL
Hgb urine dipstick: NEGATIVE
Ketones, ur: NEGATIVE mg/dL
Leukocytes,Ua: NEGATIVE
Nitrite: NEGATIVE
Protein, ur: NEGATIVE mg/dL
Specific Gravity, Urine: 1.01 (ref 1.005–1.030)
pH: 5 (ref 5.0–8.0)

## 2023-10-10 MED ORDER — LORAZEPAM 1 MG PO TABS
1.0000 mg | ORAL_TABLET | Freq: Once | ORAL | Status: DC
  Filled 2023-10-10: qty 1

## 2023-10-10 MED ORDER — PYRIDOXINE HCL 25 MG PO TABS
25.0000 mg | ORAL_TABLET | Freq: Every day | ORAL | Status: DC
  Administered 2023-10-11: 25 mg via ORAL
  Filled 2023-10-10: qty 1

## 2023-10-10 MED ORDER — OLANZAPINE 5 MG PO TABS
5.0000 mg | ORAL_TABLET | Freq: Every day | ORAL | Status: DC
  Administered 2023-10-10: 5 mg via ORAL
  Filled 2023-10-10: qty 1

## 2023-10-10 MED ORDER — METOPROLOL SUCCINATE ER 25 MG PO TB24
25.0000 mg | ORAL_TABLET | Freq: Every day | ORAL | Status: DC
  Administered 2023-10-11: 25 mg via ORAL
  Filled 2023-10-10: qty 1

## 2023-10-10 MED ORDER — FOLIC ACID 1 MG PO TABS
1.0000 mg | ORAL_TABLET | Freq: Every day | ORAL | Status: DC
  Administered 2023-10-11: 1 mg via ORAL
  Filled 2023-10-10: qty 1

## 2023-10-10 MED ORDER — LOSARTAN POTASSIUM 50 MG PO TABS
50.0000 mg | ORAL_TABLET | Freq: Every day | ORAL | Status: DC
  Administered 2023-10-11: 50 mg via ORAL
  Filled 2023-10-10: qty 1

## 2023-10-10 MED ORDER — LORAZEPAM 1 MG PO TABS
1.0000 mg | ORAL_TABLET | Freq: Once | ORAL | Status: AC
  Administered 2023-10-10: 1 mg via ORAL
  Filled 2023-10-10: qty 1

## 2023-10-10 NOTE — ED Notes (Signed)
 Pt in interview room with TTS and Security.

## 2023-10-10 NOTE — ED Triage Notes (Addendum)
 Pt to ED with BPD IVC, papers state pt is wandering around neighborhood having mental break and is danger to self. States has not slept in 2 days because daughter is at [facility] state and wants to be with her. Denies SI/HI. States I've had military jets following me, my phone survellienced, people following me, my card has been declined, I'm not taking a shot, I just need to go to a high school in [place] to be with my children and keep them safe  Pt stating she has cancer and cannot have temperature taken, when asked how long has had cancer pt states who said I had cancer, maybe you're not listening because you keep cutting me off

## 2023-10-10 NOTE — ED Notes (Signed)
 Pt given behavior approved sleep mask and earplugs to help her sleep.

## 2023-10-10 NOTE — ED Provider Notes (Signed)
 [facility] Provider Note    Event Date/Time   First MD Initiated Contact with Patient 10/10/23 1858     (approximate)   History   IVC   HPI  Sylvia Beasley is a 58 y.o. female with rheumatoid arthritis recently initiated on methotrexate  (has not taken the prednisone  prescribed), hypertension who presents on IVC.  Please as patient was was found wandering around the neighborhood acting erratically and stated apparently to police I have had military jets following me my phone surveillance..  Patient tells me that she has not slept in 2 days.  States that she is trying to get to her daughter who is at [facility].  Denies any SI or HI.  Reports that people were not just let her talk.  States that she does not want any mental diagnoses in her chart.  States that she came voluntarily.      Physical Exam   Triage Vital Signs: ED Triage Vitals  Encounter Vitals Group     BP 10/10/23 1844 (!) 185/106     Girls Systolic BP Percentile --      Girls Diastolic BP Percentile --      Boys Systolic BP Percentile --      Boys Diastolic BP Percentile --      Pulse Rate 10/10/23 1844 (!) 130     Resp 10/10/23 1844 18     Temp 10/10/23 1845 98.7 F (37.1 C)     Temp src --      SpO2 10/10/23 1844 97 %     Weight 10/10/23 1847 129 lb (58.5 kg)     Height 10/10/23 1847 5' 3 (1.6 m)     Head Circumference --      Peak Flow --      Pain Score 10/10/23 1845 0     Pain Loc --      Pain Education --      Exclude from Growth Chart --     Most recent vital signs: Vitals:   10/10/23 1845 10/10/23 2004  BP:  (!) 164/105  Pulse:  (!) 112  Resp:    Temp: 98.7 F (37.1 C)   SpO2:  98%    Nursing Triage Note reviewed. Vital signs reviewed and patients oxygen saturation is normoxic  General: Patient is well nourished, well developed, awake and alert, resting comfortably in no acute distress Head: Normocephalic and atraumatic Eyes: Normal inspection, extraocular  muscles intact, no conjunctival pallor Ear, nose, throat: Normal external exam Neck: Normal range of motion Respiratory: Patient is in no respiratory distress, l Cardiovascular: Patient is  tachycardic,  Extremities: pulses intact with good cap refills, no LE pitting edema or calf tenderness Neuro: The patient is alert and oriented to person, place, and time, appropriately conversive, with 5/5 bilat UE/LE strength, no gross motor or sensory defects noted. Coordination appears to be adequate. Psych: Rapid pressured speech, possibly some flight of ideas, no SI or HI  ED Results / Procedures / Treatments   Labs (all labs ordered are listed, but only abnormal results are displayed) Labs Reviewed  COMPREHENSIVE METABOLIC PANEL WITH GFR - Abnormal; Notable for the following components:      Result Value   Glucose, Bld 182 (*)    Creatinine, Ser 1.32 (*)    Total Protein 9.0 (*)    AST 45 (*)    GFR, Estimated 46 (*)    All other components within normal limits  CBC - Abnormal; Notable for  the following components:   Hemoglobin 10.3 (*)    HCT 32.9 (*)    MCH 25.1 (*)    Platelets 473 (*)    All other components within normal limits  URINALYSIS, COMPLETE (UACMP) WITH MICROSCOPIC - Abnormal; Notable for the following components:   Color, Urine YELLOW (*)    APPearance HAZY (*)    Bacteria, UA RARE (*)    All other components within normal limits  ETHANOL  URINE DRUG SCREEN, QUALITATIVE ([facility] ONLY)     EKG EKG and rhythm strip are interpreted by myself:   EKG: [Tachycardic sinus rhythm] at heart rate of 113, normal QRS duration, QTc 449, nonspecific ST segments and T waves no ectopy EKG not consistent with Acute STEMI Rhythm strip: Tachycardic sinus rhythm in lead II   RADIOLOGY CT head: No intracranial hemorrhage on my independent review interpretation    PROCEDURES:  Critical Care performed: No  Procedures   MEDICATIONS ORDERED IN ED: Medications  folic acid   (FOLVITE ) tablet 1 mg (has no administration in time range)  losartan  (COZAAR ) tablet 50 mg (has no administration in time range)  metoprolol  succinate (TOPROL -XL) 24 hr tablet 25 mg (has no administration in time range)  pyridOXINE (VITAMIN B6) tablet 25 mg (has no administration in time range)  OLANZapine (ZYPREXA) tablet 5 mg (5 mg Oral Given 10/10/23 2330)  LORazepam (ATIVAN) tablet 1 mg (1 mg Oral Given 10/10/23 1922)  LORazepam (ATIVAN) tablet 1 mg (1 mg Oral Given 10/10/23 2330)     IMPRESSION / MDM / ASSESSMENT AND PLAN / ED COURSE                                Differential diagnosis includes, but is not limited to, organic psychiatric disease, medication adverse effect, substance use, arrhythmia, electrolyte derangement anemia   ED course: Patient presents acutely on an IVC from police.  On my exam she seems to have pressured speech and flight of ideas which in the setting of insomnia may be consistent with mania.  She denies any physical complaints and presentation does not seem consistent with any toxidrome.  Obtaining basic blood work and given tachycardia and EKG.  She request something for sleep and I have ordered her dose of Ativan p.o.  Will continue the IVC and have psychiatry assess her.  The patient has been placed in psychiatric observation due to the need to provide a safe environment for the patient while obtaining psychiatric consultation and evaluation, as well as ongoing medical and medication management to treat the patient's condition.  The patient has been placed under full IVC at this time.    Clinical Course as of 10/10/23 2331  Wed Oct 10, 2023  2040 cbc(!) No leukocytosis or profound anemia [HD]  2040 Comprehensive metabolic panel(!) Mild AKI.  Patient is able to take p.o. [HD]  2108 Urinalysis, Complete w Microscopic -Urine, Clean Catch(!) Not consistent with UTI [HD]  2108 Urine Drug Screen, Qualitative UDS unremarkable [HD]  2219 CT Head Wo  Contrast No intracranial hemorrhage noted [HD]  2330 Patient discussed with psychiatry and agree with IVC.  Given patient's lack of sleep I have initiated per psychiatry recommendations nightly olanzapine.  He signed out to oncoming physician pending inpatient psychiatric placement [HD]    Clinical Course User Index [HD] Nicholaus Rolland BRAVO, MD   -- Risk: 5 This patient has a high risk of morbidity due to further diagnostic testing  or treatment. Rationale: This patient's evaluation and management involve a high risk of morbidity due to the potential severity of presenting symptoms, need for diagnostic testing, and/or initiation of treatment that may require close monitoring. The differential includes conditions with potential for significant deterioration or requiring escalation of care. Treatment decisions in the ED, including medication administration, procedural interventions, or disposition planning, reflect this level of risk. COPA: 5 The patient has the following acute or chronic illness/injury that poses a possible threat to life or bodily function: [X] : The patient has a potentially serious acute condition or an acute exacerbation of a chronic illness requiring urgent evaluation and management in the Emergency Department. The clinical presentation necessitates immediate consideration of life-threatening or function-threatening diagnoses, even if they are ultimately ruled out.   FINAL CLINICAL IMPRESSION(S) / ED DIAGNOSES   Final diagnoses:  Insomnia, unspecified type  Bizarre behavior     Rx / DC Orders   ED Discharge Orders     None        Note:  This document was prepared using Dragon voice recognition software and may include unintentional dictation errors.   Nicholaus Rolland BRAVO, MD 10/10/23 [phone]

## 2023-10-10 NOTE — BH Assessment (Signed)
 Comprehensive Clinical Assessment (CCA) Note  10/10/2023 Sylvia Beasley [id number]  Chief Complaint: Patient is a 58 year old female presenting to [facility] ED under IVC. Per triage note Pt to ED with BPD IVC, papers state pt is wandering around neighborhood having mental break and is danger to self. States has not slept in 2 days because daughter is at [facility] state and wants to be with her. Denies SI/HI. States I've had military jets following me, my phone survellienced, people following me, my card has been declined, I'm not taking a shot, I just need to go to a high school in [place] to be with my children and keep them safe. Pt stating she has cancer and cannot have temperature taken, when asked how long has had cancer pt states who said I had cancer, maybe you're not listening because you keep cutting me off. During assessment patient appears alert and oriented x4, calm, somewhat cooperative but also guarded. Patient reports I'm feeling stressed, I haven't slept in 2 days, I'm on chemotherapy and I haven't slept. When asked if the patient has ever felt these symptoms before she denies and also denies having any mental health issues I'm not hallucinating, I'm not paranoid, I volunteered myself to be here and I need sleep. They keep saying I'm involuntary here but I'm not. Patient denies current SI/HI/AH/VH. Chief Complaint  Patient presents with   IVC   Visit Diagnosis:     CCA Screening, Triage and Referral (STR)  Patient Reported Information How did you hear about us ? Legal System  Referral name: No data recorded Referral phone number: No data recorded  Whom do you see for routine medical problems? No data recorded Practice/Facility Name: No data recorded Practice/Facility Phone Number: No data recorded Name of Contact: No data recorded Contact Number: No data recorded Contact Fax Number: No data recorded Prescriber Name: No data recorded Prescriber Address (if known): No data  recorded  What Is the Reason for Your Visit/Call Today? Pt to ED with BPD IVC, papers state pt is wandering around neighborhood having mental break and is danger to self. States has not slept in 2 days because daughter is at [facility] state and wants to be with her. Denies SI/HI. States I've had military jets following me, my phone survellienced, people following me, my card has been declined, I'm not taking a shot, I just need to go to a high school in [place] to be with my children and keep them safe     Pt stating she has cancer and cannot have temperature taken, when asked how long has had cancer pt states who said I had cancer, maybe you're not listening because you keep cutting me off  How Long Has This Been Causing You Problems? <Week  What Do You Feel Would Help You the Most Today? No data recorded  Have You Recently Been in Any Inpatient Treatment (Hospital/Detox/Crisis Center/28-Day Program)? No data recorded Name/Location of Program/Hospital:No data recorded How Long Were You There? No data recorded When Were You Discharged? No data recorded  Have You Ever Received Services From [facility] Before? No data recorded Who Do You See at [facility]? No data recorded  Have You Recently Had Any Thoughts About Hurting Yourself? No  Are You Planning to Commit Suicide/Harm Yourself At This time? No   Have you Recently Had Thoughts About Hurting Someone [place]? No  Explanation: No data recorded  Have You Used Any Alcohol or Drugs in the Past 24 Hours? No  How  Long Ago Did You Use Drugs or Alcohol? No data recorded What Did You Use and How Much? No data recorded  Do You Currently Have a Therapist/Psychiatrist? No  Name of Therapist/Psychiatrist: No data recorded  Have You Been Recently Discharged From Any Office Practice or Programs? No  Explanation of Discharge From Practice/Program: No data recorded    CCA Screening Triage Referral Assessment Type of Contact: Face-to-Face  Is  this Initial or Reassessment? No data recorded Date Telepsych consult ordered in [facility]:  No data recorded Time Telepsych consult ordered in [facility]:  No data recorded  Patient Reported Information Reviewed? No data recorded Patient Left Without Being Seen? No data recorded Reason for Not Completing Assessment: No data recorded  Collateral Involvement: No data recorded  Does Patient Have a Court Appointed Legal Guardian? No data recorded Name and Contact of Legal Guardian: No data recorded If Minor and Not Living with Parent(s), Who has Custody? No data recorded Is CPS involved or ever been involved? Never  Is APS involved or ever been involved? Never   Patient Determined To Be At Risk for Harm To Self or Others Based on Review of Patient Reported Information or Presenting Complaint? No  Method: No data recorded Availability of Means: No data recorded Intent: No data recorded Notification Required: No data recorded Additional Information for Danger to Others Potential: No data recorded Additional Comments for Danger to Others Potential: No data recorded Are There Guns or Other Weapons in Your Home? No  Types of Guns/Weapons: No data recorded Are These Weapons Safely Secured?                            No data recorded Who Could Verify You Are Able To Have These Secured: No data recorded Do You Have any Outstanding Charges, Pending Court Dates, Parole/Probation? No data recorded Contacted To Inform of Risk of Harm To Self or Others: No data recorded  Location of Assessment: [facility]   Does Patient Present under Involuntary Commitment? Yes  IVC Papers Initial File Date: No data recorded  [place] of Residence: [place]   Patient Currently Receiving the Following Services: No data recorded  Determination of Need: Emergent (2 hours)   Options For Referral: No data recorded    CCA Biopsychosocial Intake/Chief Complaint:  No data recorded Current Symptoms/Problems: No data  recorded  Patient Reported Schizophrenia/Schizoaffective Diagnosis in Past: No   Strengths: Patient is able to communicate her needs; has the support of her family  Preferences: No data recorded Abilities: No data recorded  Type of Services Patient Feels are Needed: No data recorded  Initial Clinical Notes/Concerns: No data recorded  Mental Health Symptoms Depression:  None   Duration of Depressive symptoms: No data recorded  Mania:  None   Anxiety:   Difficulty concentrating; Fatigue; Irritability; Restlessness; Worrying; Sleep   Psychosis:  Delusions   Duration of Psychotic symptoms: Less than six months   Trauma:  None   Obsessions:  Poor insight   Compulsions:  Poor Insight; Absent insight/delusional   Inattention:  None   Hyperactivity/Impulsivity:  None   Oppositional/Defiant Behaviors:  None   Emotional Irregularity:  None   Other Mood/Personality Symptoms:  No data recorded   Mental Status Exam Appearance and self-care  Stature:  Average   Weight:  Average weight   Clothing:  Casual   Grooming:  Normal   Cosmetic use:  None   Posture/gait:  Normal   Motor  activity:  Not Remarkable   Sensorium  Attention:  Normal   Concentration:  Normal   Orientation:  X5   Recall/memory:  Normal   Affect and Mood  Affect:  Appropriate   Mood:  Irritable   Relating  Eye contact:  Normal   Facial expression:  Responsive   Attitude toward examiner:  Guarded; Suspicious   Thought and Language  Speech flow: Clear and Coherent   Thought content:  Appropriate to Mood and Circumstances   Preoccupation:  None   Hallucinations:  None   Organization:  No data recorded  [employer] of Knowledge:  Fair   Intelligence:  Average   Abstraction:  Normal   Judgement:  Poor   Reality Testing:  Distorted   Insight:  Lacking   Decision Making:  Only simple   Social Functioning  Social Maturity:  Responsible   Social  Judgement:  Normal   Stress  Stressors:  Other (Comment); Illness   Coping Ability:  Exhausted   Skill Deficits:  None   Supports:  Family     Religion: Religion/Spirituality Are You A Religious Person?: No  Leisure/Recreation: Leisure / Recreation Do You Have Hobbies?: No  Exercise/Diet: Exercise/Diet Do You Exercise?: No Have You Gained or Lost A Significant Amount of Weight in the Past Six Months?: No Do You Follow a Special Diet?: No Do You Have Any Trouble Sleeping?: Yes Explanation of Sleeping Difficulties: Patient reports not sleeping for 2 days   CCA Employment/Education Employment/Work Situation: Employment / Work Situation Has Patient ever Been in Equities trader?: No  Education: Education Is Patient Currently Attending School?: No Did You Have An Individualized Education Program (IIEP): No Did You Have Any Difficulty At [employer]?: No Patient's Education Has Been Impacted by Current Illness: No   CCA Family/Childhood History Family and Relationship History: Family history Marital status: Married Number of Years Married:  (unknown) What types of issues is patient dealing with in the relationship?: No reported issues Additional relationship information: Unknown Does patient have children?: Yes How many children?: 1 How is patient's relationship with their children?: Patient has a good relationship with her daughter  Childhood History:  Childhood History Did patient suffer any verbal/emotional/physical/sexual abuse as a child?: No Did patient suffer from severe childhood neglect?: No Has patient ever been sexually abused/assaulted/raped as an adolescent or adult?: No Was the patient ever a victim of a crime or a disaster?: No Witnessed domestic violence?: No Has patient been affected by domestic violence as an adult?: No  Child/Adolescent Assessment:     CCA Substance Use Alcohol/Drug Use: Alcohol / Drug Use Pain Medications: see  mar Prescriptions: see mar Over the Counter: see mar History of alcohol / drug use?: No history of alcohol / drug abuse                         ASAM's:  Six Dimensions of Multidimensional Assessment  Dimension 1:  Acute Intoxication and/or Withdrawal Potential:      Dimension 2:  Biomedical Conditions and Complications:      Dimension 3:  Emotional, Behavioral, or Cognitive Conditions and Complications:     Dimension 4:  Readiness to Change:     Dimension 5:  Relapse, Continued use, or Continued Problem Potential:     Dimension 6:  Recovery/Living Environment:     ASAM Severity Score:    ASAM Recommended Level of Treatment:     Substance use Disorder (SUD)  Recommendations for Services/Supports/Treatments:    DSM5 Diagnoses: Patient Active Problem List   Diagnosis Date Noted   Rheumatoid arthritis involving multiple sites with positive rheumatoid factor (HCC) 09/11/2023   Primary insomnia 09/11/2023   Arthralgia 08/02/2023   Moderate mixed hyperlipidemia not requiring statin therapy 05/18/2022   Prediabetes 05/18/2022   Atrophic vaginitis 05/18/2022   Hot flashes due to menopause 05/18/2022   Primary hypertension 05/18/2022   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

## 2023-10-10 NOTE — Consult Note (Signed)
 [facility] Consult Note  Patient Name: Sylvia Beasley MRN: [id number] DOB: 1965/06/19 DATE OF Consult: 10/10/2023  PRIMARY PSYCHIATRIC DIAGNOSES Rule out Medication induced psychotic disorder; Rule out unspecified schizophrenia spectrum and other psychotic disorder; Rule out unspecified bipolar and related disorder; Rule out delirium due to general medical condition; Rule out neurocognitive disorder with behavioral disturbance.  Based on my current evaluation and assessment of the patient, she is a 58 y.o. female with no previous mental health history with symptoms concerning for mania. Given the rapid onset of symptoms would posit that initiation of methotrexate  may have contributed to this presentation as there are rare cases of methotrexate  induced psychosis that present as mania. However, would also be cautious to rule out medical contributors to altered mental status, which is more likely with a rapid onset of psychosis such as in this case. As such, it would be appropriate to order head imaging especially as patient has never previously exhibited a psychiatric decompensation. Given patient's significant lack of insight, she was unable to meaningfully engage in safety planning. Moreover, collateral from spouse indicates that patient is at high risk to self or others. Patient's presentation is consistent with Rule out Medication induced psychotic disorder; Rule out unspecified schizophrenia spectrum and other psychotic disorder; Rule out unspecified bipolar and related disorder; Rule out delirium due to general medical condition; Rule out neurocognitive disorder with behavioral disturbance. Therefore, patient does meet criteria for an intensive inpatient psychiatric hospitalization once medically cleared.  RECOMMENDATIONS  Inpatient psychiatric admission recommended?   YES, patient is at high risk to self at this time. Requires involuntary admission if patient does not agree to voluntary psychiatric  admission once patient is medically cleared.   Medication recommendations:  Risks, benefits, side effects and alternatives to treatments reviewed:  -Please discontinue methotrexate  due to concern that patient may have developed rare side effect of methotrexate  induced psychosis, which may appear akin to mania -Consider starting olanzapine 5 mg at bedtime for mood instability and thought disorganization. Side effects include: Dizziness, lightheadedness, drowsiness, nausea, vomiting, tiredness, excess saliva/drooling, blurred vision, weight gain, constipation, headache, restlessness (especially in the legs), shaking (tremor), muscle spasm, mask-like expression of the face, trouble controlling certain urges (such as gambling, sex, eating or shopping), unusual uncontrolled movements called tardive dyskinesia (these uncontrolled movements are often of the face, mouth, tongue, arms, or legs),  and trouble sleeping may occur. Note the following serious side effects:  fainting, suicidal thoughts, trouble swallowing, and seizures.  As needed medications to manage patient's acute symptoms while in hospital care: QTc is 449 ms as of 10/2023 -Maximize utilization of verbal de-escalation techniques, if attempts are unsuccessful and patient poses a threat to self and others: Consider olanzapine (Zyprexa) 2.5 mg to 5 mg PO/IM every 6 hours as needed for severe agitation. Would offer patient the option of taking PO medication first, but if patient refuses then may administer IM medication as a last resort. Would not exceed 20 mg of olanzapine within a 24-hour period. Avoid co-administering intramuscular olanzapine with intravenous benzodiazepine, as giving both medications concurrently is associated with respiratory depression.   Non-Medication recommendations:  -Note: Please stop all antipsychotic and QTc prolonging medications if patient's QTc is greater than 480 ms. Of note, to decrease the risk of prolonged QTc,  please maintain potassium and magnesium levels within normal ranges. -Agree with work up for organic causes of altered mentation and mood dysregulation, consider the following if not already performed and clinically appropriate: CT of the head, CBC and  differential, basic metabolic profile, liver function tests (if abnormal consider ammonia level), urinalysis, urine toxicology screen, vitamin B12 level, vitamin D  level, TSH with reflex free T4  Observation recommendations:  per unit protocol for monitoring psychotic patient    Treatment team members, and family members if applicable, with whom risk formulation and management, and other related findings, were reviewed include the following: primary team   Follow-Up Telepsychiatry C/L services: Will sign off for now. Please re-consult our service as necessary.  Thank you for involving us  in the care of this patient. If you have any additional questions or concerns, please call [phone] and ask for me or the provider on-call.   Total time spent in this encounter was 70 minutes with greater than 50% of time spent in counseling and coordination of care.  TELEPSYCHIATRY ATTESTATION & CONSENT  As the provider for this telehealth consult, I attest that I verified the patient's identity using two separate identifiers, introduced myself to the patient, provided my credentials, disclosed my location, and performed this encounter via a HIPAA-compliant, real-time, face-to-face, two-way, interactive audio and video platform and with the full consent and agreement of the patient (or guardian as applicable.)  Patient physical location: [facility] at [facility] . Telehealth provider physical location: home office in state of [place].  Video start time: 1940  (Central Time) Video end time: 2000 (Central Time)  IDENTIFYING DATA  Sylvia Beasley is a 58 y.o. year-old female for whom a psychiatric consultation has been ordered by the primary  provider. The patient was identified using two separate identifiers.  CHIEF COMPLAINT/REASON FOR CONSULT  Behavioral health concerns   HISTORY OF PRESENT ILLNESS (HPI)  I evaluated the patient today face-to-face via secure, HIPAA-compliant telepsychiatric connection, and at the request of the primary treatment team. The reason for the telepsychiatric consultation is that the patient is a 58  year old female with a history of hidradenitis suppurativa, primary hypertension, and rheumatoid arthritis who presents for psychiatric evaluation on an IVC given paranoid delusions and disorganized thoughts. Primary team is seeking psychotropic medication recommendations, safety evaluation to determine appropriateness for more intensive psychiatric services and diagnostic clarity as to the patient's presentation.   During one-on-one evaluation with this provider, patient was alert and oriented to self and very generally to location, but patient was not oriented to situation. The patient did appear to be inappropriately internally preoccupied (she was distractible and appeared to attend to unremarkable aspects of her room); patient's thought process was slowed with marked paucity of spontaneous thought. Patient related that she would like to return home now. She explained that all that troubled her was poor sleep over the past 2 days. Now that patient has had some rest, she feels that she can return home. Patient was guarded with a marked irritable edge, which flared when asked safety questions. Patient denies suicidal and homicidal ideations, auditory and visual perceptual disturbances, and paranoid delusions.   Per collateral from [place] (spouse) who was contacted at [phone] at 7:48 pm CST on 10/10/2023 for a call duration of 17 minutes and 36 seconds: This provider clearly identified self, providing name and role in the care team. Per spouse patient has no known mental health or cancer history. Following  initiation of methotrexate  recently for management of rheumatoid arthritis, patient started to demonstrate thought disorganization, paranoid delusions, and appeared to be responding to auditory and visual hallucinations. Patient has not been sleeping either, which appears to be worsening her mental status. He  is concerned that given the severity of her thought disorder, that she cannot be safely maintained in the community. He feels that patient would benefit from more intensive medical and mental health treatment.   PAST PSYCHIATRIC HISTORY  Inpatient psychiatric treatment: per spouse, denies   Current home psychotropic medications: per spouse, denies   Previous mental health diagnoses: per spouse, denies   Suicide attempts: per patient, denies  Trauma history: patient did not assert further current concerns for abuse, trauma, exploitation or neglect beyond described in the HPI  Otherwise as per HPI above.  PAST MEDICAL HISTORY  Past Medical History:  Diagnosis Date   Allergy    Anemia    Arthritis    rheumatiod   Hypertension    Pre-diabetes      HOME MEDICATIONS  Facility Ordered Medications  Medication   [COMPLETED] LORazepam (ATIVAN) tablet 1 mg   PTA Medications  Medication Sig   pyridOXINE (VITAMIN B6) 25 MG tablet Take 25 mg by mouth daily.   Cats Claw, Uncaria Tomentosa, (CATS CLAW PO) Take 1 tablet by mouth daily.   BLACK COHOSH COMPOUND PO Take 1 tablet by mouth at bedtime.   Turmeric (QC TUMERIC COMPLEX) 500 MG CAPS Take 1,000 mg by mouth.   Moringa 500 MG CAPS Take 1,500 mg by mouth.   meloxicam  (MOBIC ) 15 MG tablet Take 1 tablet (15 mg total) by mouth daily.   traZODone  (DESYREL ) 50 MG tablet Take 0.5-1 tablets (25-50 mg total) by mouth at bedtime as needed for sleep.   losartan  (COZAAR ) 50 MG tablet TAKE 1 TABLET BY MOUTH EVERY DAY   metoprolol  succinate (TOPROL -XL) 25 MG 24 hr tablet TAKE 1 TABLET (25 MG TOTAL) BY MOUTH DAILY.   methotrexate  (RHEUMATREX) 2.5 MG  tablet Take 12.5 mg by mouth once a week. Tuesday   folic acid  (FOLVITE ) 1 MG tablet Take 1 mg by mouth daily.   triamcinolone cream (KENALOG) 0.5 % Apply topically. (Patient not taking: Reported on 10/10/2023)   ACCU-CHEK GUIDE TEST test strip as directed.   aspirin  EC 81 MG tablet Take 1 tablet (81 mg total) by mouth in the morning and at bedtime. Swallow whole. (Patient not taking: Reported on 10/10/2023)     ALLERGIES  Allergies  Allergen Reactions   Shellfish Allergy Shortness Of Breath   Zyrtec [Cetirizine] Shortness Of Breath   Ace Inhibitors Other (See Comments)   Ipratropium Bromide Other (See Comments)   Milk-Related Compounds    Fexofenadine Rash    Allegra   Rofecoxib Rash    Vioxx    SOCIAL & SUBSTANCE USE HISTORY  Social History   Socioeconomic History   Marital status: Married    Spouse name: Not on file   Number of children: 2   Years of education: Not on file   Highest education level: Associate degree: occupational, Scientist, product/process development, or vocational program  Occupational History   Not on file  Tobacco Use   Smoking status: Former    Current packs/day: 0.00    Types: Cigarettes    Quit date: 01/03/1988    Years since quitting: 35.7   Smokeless tobacco: Never  Vaping Use   Vaping status: Never Used  Substance and Sexual Activity   Alcohol use: Never   Drug use: No   Sexual activity: Yes    Partners: Male    Birth control/protection: Post-menopausal  Other Topics Concern   Not on file  Social History Narrative   Not on file   Social Drivers of  Health   Financial Resource Strain: Low Risk  (09/24/2023)   Received from [facility]   Overall Financial Resource Strain (CARDIA)    Difficulty of Paying Living Expenses: Not hard at all  Food Insecurity: No Food Insecurity (09/24/2023)   Received from [facility]   Hunger Vital Sign    Within the past 12 months, you worried that your food would run out before you got the money to  buy more.: Never true    Within the past 12 months, the food you bought just didn't last and you didn't have money to get more.: Never true  Transportation Needs: No Transportation Needs (09/24/2023)   Received from [facility] - Transportation    In the past 12 months, has lack of transportation kept you from medical appointments or from getting medications?: No    Lack of Transportation (Non-Medical): No  Physical Activity: Sufficiently Active (03/01/2023)   Exercise Vital Sign    Days of Exercise per Week: 6 days    Minutes of Exercise per Session: 30 min  Stress: No Stress Concern Present (03/01/2023)   [employer] of Occupational Health - Occupational Stress Questionnaire    Feeling of Stress : Not at all  Social Connections: Moderately Integrated (03/01/2023)   Social Connection and Isolation Panel    Frequency of Communication with Friends and Family: More than three times a week    Frequency of Social Gatherings with Friends and Family: Once a week    Attends Religious Services: More than 4 times per year    Active Member of [employer] or Organizations: No    Attends Engineer, structural: Not on file    Marital Status: Married   Social History   Tobacco Use  Smoking Status Former   Current packs/day: 0.00   Types: Cigarettes   Quit date: 01/03/1988   Years since quitting: 35.7  Smokeless Tobacco Never   Social History   Substance and Sexual Activity  Alcohol Use Never   Social History   Substance and Sexual Activity  Drug Use No    Additional pertinent information none disclosed .  FAMILY HISTORY  Family History  Problem Relation Age of Onset   Kidney disease Mother        died of ESRD   Diabetes Mother    Arthritis Father    Dementia Father    Asthma Sister    Breast cancer Sister 24   Glaucoma Sister    Diabetes Sister    Diabetes Sister        s/p BKA   Throat cancer Paternal Grandmother    Pancreatic cancer  Paternal Uncle    Colon cancer Neg Hx     MENTAL STATUS EXAM (MSE)  Mental Status Exam: General Appearance: Well Groomed  Orientation:  Other:  oriented to self and generally to location only  Memory:  Immediate;   Poor Recent;   Poor Remote;   Poor  Concentration:  Concentration: Poor and Attention Span: Poor  Recall:  Fair  Attention  Fair  Eye Contact:  Minimal  Speech:  Slow  Language:  Fair  Volume:  Normal  Mood: fine  Affect:  Non-Congruent  Thought Process:  concrete  Thought Content:  Illogical and Delusions  Suicidal Thoughts:  No  Homicidal Thoughts:  No  Judgement:  Poor  Insight:  Lacking  Psychomotor Activity:  Decreased  Akathisia:  No  Fund of Knowledge:  Poor  Assets:  Social Support  Cognition:  Impaired,  Severe  ADL's:  Impaired  AIMS (if indicated):       VITALS  Blood pressure (!) 164/105, pulse (!) 112, temperature 98.7 F (37.1 C), resp. rate 18, height 5' 3 (1.6 m), weight 58.5 kg, last menstrual period 05/22/2015, SpO2 98%.  LABS  Admission on 10/10/2023  Component Date Value Ref Range Status   Sodium 10/10/2023 137  135 - 145 mmol/L Final   Potassium 10/10/2023 3.7  3.5 - 5.1 mmol/L Final   Chloride 10/10/2023 99  98 - 111 mmol/L Final   CO2 10/10/2023 24  22 - 32 mmol/L Final   Glucose, Bld 10/10/2023 182 (H)  70 - 99 mg/dL Final   Glucose reference range applies only to samples taken after fasting for at least 8 hours.   BUN 10/10/2023 17  6 - 20 mg/dL Final   Creatinine, Ser 10/10/2023 1.32 (H)  0.44 - 1.00 mg/dL Final   Calcium 89/91/7974 9.8  8.9 - 10.3 mg/dL Final   Total Protein 89/91/7974 9.0 (H)  6.5 - 8.1 g/dL Final   Albumin 89/91/7974 4.4  3.5 - 5.0 g/dL Final   AST 89/91/7974 45 (H)  15 - 41 U/L Final   ALT 10/10/2023 30  0 - 44 U/L Final   Alkaline Phosphatase 10/10/2023 95  38 - 126 U/L Final   Total Bilirubin 10/10/2023 0.8  0.0 - 1.2 mg/dL Final   GFR, Estimated 10/10/2023 46 (L)  >60 mL/min Final   Comment:  (NOTE) Calculated using the CKD-EPI Creatinine Equation (2021)    Anion gap 10/10/2023 14  5 - 15 Final   Performed at [facility], [address] [place], [place] [address]   Alcohol, Ethyl (B) 10/10/2023 <15  <15 mg/dL Final   Comment: (NOTE) For medical purposes only. Performed at [facility], [address] [place], [place] [address]    WBC 10/10/2023 5.5  4.0 - 10.5 K/uL Final   RBC 10/10/2023 4.10  3.87 - 5.11 MIL/uL Final   Hemoglobin 10/10/2023 10.3 (L)  12.0 - 15.0 g/dL Final   HCT 89/91/7974 32.9 (L)  36.0 - 46.0 % Final   MCV 10/10/2023 80.2  80.0 - 100.0 fL Final   MCH 10/10/2023 25.1 (L)  26.0 - 34.0 pg Final   MCHC 10/10/2023 31.3  30.0 - 36.0 g/dL Final   RDW 89/91/7974 14.6  11.5 - 15.5 % Final   Platelets 10/10/2023 473 (H)  150 - 400 K/uL Final   nRBC 10/10/2023 0.0  0.0 - 0.2 % Final   Performed at [facility], [address] [place], [place] [address]   Tricyclic, Ur Screen 10/10/2023 NONE DETECTED  NONE DETECTED Final   Amphetamines, Ur Screen 10/10/2023 NONE DETECTED  NONE DETECTED Final   MDMA (Ecstasy)Ur Screen 10/10/2023 NONE DETECTED  NONE DETECTED Final   Cocaine Metabolite,Ur [place] 10/10/2023 NONE DETECTED  NONE DETECTED Final   Opiate, Ur Screen 10/10/2023 NONE DETECTED  NONE DETECTED Final   Phencyclidine (PCP) Ur S 10/10/2023 NONE DETECTED  NONE DETECTED Final   Cannabinoid 50 Ng, Ur [place] 10/10/2023 NONE DETECTED  NONE DETECTED Final   Barbiturates, Ur Screen 10/10/2023 NONE DETECTED  NONE DETECTED Final   Benzodiazepine, Ur Scrn 10/10/2023 NONE DETECTED  NONE DETECTED Final   Methadone Scn, Ur 10/10/2023 NONE DETECTED  NONE DETECTED Final   Comment: (NOTE) Tricyclics + metabolites, urine    Cutoff 1000 ng/mL Amphetamines + metabolites, urine  Cutoff  1000 ng/mL MDMA (Ecstasy), urine              Cutoff 500 ng/mL Cocaine Metabolite, urine          Cutoff 300 ng/mL Opiate + metabolites, urine        Cutoff 300  ng/mL Phencyclidine (PCP), urine         Cutoff 25 ng/mL Cannabinoid, urine                 Cutoff 50 ng/mL Barbiturates + metabolites, urine  Cutoff 200 ng/mL Benzodiazepine, urine              Cutoff 200 ng/mL Methadone, urine                   Cutoff 300 ng/mL  The urine drug screen provides only a preliminary, unconfirmed analytical test result and should not be used for non-medical purposes. Clinical consideration and professional judgment should be applied to any positive drug screen result due to possible interfering substances. A more specific alternate chemical method must be used in order to obtain a confirmed analytical result. Gas chromatography / mass spectrometry (GC/MS) is the preferred confirm                          atory method. Performed at [facility], [address] [place], [place] [address]    Color, Urine 10/10/2023 YELLOW (A)  YELLOW Final   APPearance 10/10/2023 HAZY (A)  CLEAR Final   Specific Gravity, Urine 10/10/2023 1.010  1.005 - 1.030 Final   pH 10/10/2023 5.0  5.0 - 8.0 Final   Glucose, UA 10/10/2023 NEGATIVE  NEGATIVE mg/dL Final   Hgb urine dipstick 10/10/2023 NEGATIVE  NEGATIVE Final   Bilirubin Urine 10/10/2023 NEGATIVE  NEGATIVE Final   Ketones, ur 10/10/2023 NEGATIVE  NEGATIVE mg/dL Final   Protein, ur 89/91/7974 NEGATIVE  NEGATIVE mg/dL Final   Nitrite 89/91/7974 NEGATIVE  NEGATIVE Final   Leukocytes,Ua 10/10/2023 NEGATIVE  NEGATIVE Final   RBC / HPF 10/10/2023 6-10  0 - 5 RBC/hpf Final   WBC, UA 10/10/2023 0-5  0 - 5 WBC/hpf Final   Bacteria, UA 10/10/2023 RARE (A)  NONE SEEN Final   Squamous Epithelial / HPF 10/10/2023 0-5  0 - 5 /HPF Final   Mucus 10/10/2023 PRESENT   Final   Amorphous Crystal 10/10/2023 PRESENT   Final   Performed at [facility], [address], [place], [place] [address]    PSYCHIATRIC REVIEW OF SYSTEMS (ROS)  ROS: Notable for the following relevant positive findings: Review of Systems   Psychiatric/Behavioral:  Negative for depression, hallucinations, memory loss, substance abuse and suicidal ideas. The patient is not nervous/anxious and does not have insomnia.     Additional findings:      Musculoskeletal: No abnormal movements observed      Gait & Station: Laying/Sitting      Pain Screening: Denies      Nutrition & Dental Concerns: none disclosed  RISK FORMULATION/ASSESSMENT  Is the patient experiencing any suicidal or homicidal ideations: No  Protective factors considered for safety management: Current care in a highly monitored health care setting  Risk factors/concerns considered for safety management:  Physical illness/chronic pain Age over 10 Impulsivity Barriers to accessing treatment Unwillingness to seek help  Is there a safety management plan with the patient and treatment team to minimize risk factors and promote protective factors: Yes  Explain: psychiatric hospitalization following medical clearance  Is crisis care placement or psychiatric hospitalization recommended: Yes     Based on my current evaluation and risk assessment, patient is determined at this time to be at:  High risk  *RISK ASSESSMENT Risk assessment is a dynamic process; it is possible that this patient's condition, and risk level, may change. This should be re-evaluated and managed over time as appropriate. Please re-consult psychiatric consult services if additional assistance is needed in terms of risk assessment and management. If your team decides to discharge this patient, please advise the patient how to best access emergency psychiatric services, or to call 911, if their condition worsens or they feel unsafe in any way.   Charlene Buba, MD Telepsychiatry Consult Services

## 2023-10-10 NOTE — BH Assessment (Addendum)
 This writer attempted to contact [facility] to schedule an assessment for patient at the times of 7:50pm and 8:05pm but no answer at either attempts, voicemail was left to return phone call  Update 8:08pm: [facility] contacted this writer back and scheduled assessment for 2100

## 2023-10-10 NOTE — ED Notes (Signed)
 This RN and EDP at bedside. Pt portraying manic behaviors. States she hasn't slept in two days. Thoughts are disorganized. States she doesn't have any psychiatric diagnosis and that she is just tired.

## 2023-10-10 NOTE — ED Notes (Signed)
 PT given phone to make a call to her daughter. PT telling daughter that she knows their phones are under surveillance and her husband is against her. That she is not paranoid and everyone else wont believe her. PT stating she wants daughter to visit her tomorrow but daughter says she can't because she has class.   This RN talked to daughter at end of phone call and daughter stated she has never had anything like this happen before. That this manic/paranoia came out of nowhere.

## 2023-10-10 NOTE — ED Notes (Signed)
 Pt to CT with Ct tech and security.

## 2023-10-10 NOTE — ED Notes (Addendum)
 Pt belongings: White jacket x2 Bible Pack of herbs and stuff Gray pants [employer] purse Pink underwear White bra Black hair bow

## 2023-10-11 ENCOUNTER — Encounter: Payer: Self-pay | Admitting: Psychiatry

## 2023-10-11 ENCOUNTER — Inpatient Hospital Stay
Admission: AD | Admit: 2023-10-11 | Discharge: 2023-10-18 | DRG: 885 | Disposition: A | Discharge status: Home / Self Care | Source: Intra-hospital | Attending: Psychiatry | Admitting: Psychiatry

## 2023-10-11 DIAGNOSIS — Z87891 Personal history of nicotine dependence: Secondary | ICD-10-CM

## 2023-10-11 DIAGNOSIS — Z79631 Long term (current) use of antimetabolite agent: Secondary | ICD-10-CM

## 2023-10-11 DIAGNOSIS — Z91013 Allergy to seafood: Secondary | ICD-10-CM

## 2023-10-11 DIAGNOSIS — F32A Depression, unspecified: Secondary | ICD-10-CM | POA: Diagnosis present

## 2023-10-11 DIAGNOSIS — Z8419 Family history of other disorders of kidney and ureter: Secondary | ICD-10-CM

## 2023-10-11 DIAGNOSIS — Z83511 Family history of glaucoma: Secondary | ICD-10-CM

## 2023-10-11 DIAGNOSIS — Z800 Family history of malignant neoplasm of digestive organs: Secondary | ICD-10-CM

## 2023-10-11 DIAGNOSIS — Z808 Family history of malignant neoplasm of other organs or systems: Secondary | ICD-10-CM | POA: Diagnosis not present

## 2023-10-11 DIAGNOSIS — Z833 Family history of diabetes mellitus: Secondary | ICD-10-CM | POA: Diagnosis not present

## 2023-10-11 DIAGNOSIS — T451X5A Adverse effect of antineoplastic and immunosuppressive drugs, initial encounter: Secondary | ICD-10-CM | POA: Diagnosis present

## 2023-10-11 DIAGNOSIS — I10 Essential (primary) hypertension: Secondary | ICD-10-CM | POA: Diagnosis present

## 2023-10-11 DIAGNOSIS — Z79899 Other long term (current) drug therapy: Secondary | ICD-10-CM

## 2023-10-11 DIAGNOSIS — Z825 Family history of asthma and other chronic lower respiratory diseases: Secondary | ICD-10-CM

## 2023-10-11 DIAGNOSIS — Z803 Family history of malignant neoplasm of breast: Secondary | ICD-10-CM | POA: Diagnosis not present

## 2023-10-11 DIAGNOSIS — N179 Acute kidney failure, unspecified: Secondary | ICD-10-CM | POA: Diagnosis present

## 2023-10-11 DIAGNOSIS — Z8261 Family history of arthritis: Secondary | ICD-10-CM

## 2023-10-11 DIAGNOSIS — Z888 Allergy status to other drugs, medicaments and biological substances status: Secondary | ICD-10-CM

## 2023-10-11 DIAGNOSIS — F29 Unspecified psychosis not due to a substance or known physiological condition: Principal | ICD-10-CM

## 2023-10-11 DIAGNOSIS — Z791 Long term (current) use of non-steroidal anti-inflammatories (NSAID): Secondary | ICD-10-CM

## 2023-10-11 DIAGNOSIS — F22 Delusional disorders: Principal | ICD-10-CM | POA: Diagnosis present

## 2023-10-11 DIAGNOSIS — M069 Rheumatoid arthritis, unspecified: Secondary | ICD-10-CM | POA: Diagnosis present

## 2023-10-11 DIAGNOSIS — G4700 Insomnia, unspecified: Secondary | ICD-10-CM | POA: Diagnosis present

## 2023-10-11 LAB — SARS CORONAVIRUS 2 BY RT PCR: SARS Coronavirus 2 by RT PCR: NEGATIVE

## 2023-10-11 LAB — GLUCOSE, CAPILLARY: Glucose-Capillary: 120 mg/dL — ABNORMAL HIGH (ref 70–99)

## 2023-10-11 MED ORDER — MAGNESIUM HYDROXIDE 400 MG/5ML PO SUSP: 30.0000 mL | Freq: Every day | ORAL | Status: DC | PRN

## 2023-10-11 MED ORDER — TRAZODONE HCL 50 MG PO TABS
50.0000 mg | ORAL_TABLET | Freq: Every day | ORAL | Status: DC
  Administered 2023-10-11 – 2023-10-17 (×6): 50 mg via ORAL
  Filled 2023-10-11 (×6): qty 1

## 2023-10-11 MED ORDER — IBUPROFEN 200 MG PO TABS
800.0000 mg | ORAL_TABLET | Freq: Once | ORAL | Status: AC
  Administered 2023-10-11: 800 mg via ORAL
  Filled 2023-10-11: qty 4

## 2023-10-11 MED ORDER — ACETAMINOPHEN 325 MG PO TABS
650.0000 mg | ORAL_TABLET | Freq: Four times a day (QID) | ORAL | Status: DC | PRN
  Administered 2023-10-12 – 2023-10-16 (×3): 650 mg via ORAL
  Filled 2023-10-11 (×4): qty 2

## 2023-10-11 MED ORDER — ALUM & MAG HYDROXIDE-SIMETH 200-200-20 MG/5ML PO SUSP: 30.0000 mL | ORAL | Status: DC | PRN

## 2023-10-11 MED ORDER — HYDRALAZINE HCL 25 MG PO TABS
25.0000 mg | ORAL_TABLET | Freq: Three times a day (TID) | ORAL | Status: DC | PRN
  Administered 2023-10-11 – 2023-10-13 (×3): 25 mg via ORAL
  Filled 2023-10-11 (×3): qty 1

## 2023-10-11 MED ORDER — OLANZAPINE 5 MG PO TBDP
5.0000 mg | ORAL_TABLET | Freq: Three times a day (TID) | ORAL | Status: DC | PRN
  Administered 2023-10-11: 5 mg via ORAL
  Filled 2023-10-11: qty 1

## 2023-10-11 MED ORDER — PYRIDOXINE HCL 25 MG PO TABS
25.0000 mg | ORAL_TABLET | Freq: Every day | ORAL | Status: DC
  Administered 2023-10-12 – 2023-10-18 (×7): 25 mg via ORAL
  Filled 2023-10-11 (×7): qty 1

## 2023-10-11 MED ORDER — MORINGA 500 MG PO CAPS
1500.0000 mg | ORAL_CAPSULE | Freq: Every day | ORAL | Status: DC
  Filled 2023-10-11: qty 3

## 2023-10-11 MED ORDER — METOPROLOL SUCCINATE ER 25 MG PO TB24
25.0000 mg | ORAL_TABLET | Freq: Every day | ORAL | Status: DC
Start: 2023-10-12 — End: 2023-10-18
  Administered 2023-10-12 – 2023-10-18 (×8): 25 mg via ORAL
  Filled 2023-10-11 (×7): qty 1

## 2023-10-11 MED ORDER — LOSARTAN POTASSIUM 25 MG PO TABS
50.0000 mg | ORAL_TABLET | Freq: Every day | ORAL | Status: DC
  Administered 2023-10-12 – 2023-10-18 (×7): 50 mg via ORAL
  Filled 2023-10-11 (×7): qty 2

## 2023-10-11 NOTE — ED Provider Notes (Signed)
 Patient has been accepted for admission in behavior health unit.    Floy Roberts, MD 10/11/23 [phone]

## 2023-10-11 NOTE — ED Notes (Addendum)
 Pt informed this nurse that she takes vitamins in the morning and only eats low carb, unprocessed, and sugarless food that help decrease her pain from her RA. MD aware.

## 2023-10-11 NOTE — ED Notes (Signed)
 IVC /recommend inpatient psych admission when medically cleared

## 2023-10-11 NOTE — Tx Team (Signed)
 Initial Treatment Plan 10/11/2023 10:36 PM [name] [id number]    PATIENT STRESSORS: Health problems   Marital or family conflict     PATIENT STRENGTHS: Communication skills  Motivation for treatment/growth  Supportive family/friends    PATIENT IDENTIFIED PROBLEMS: Insomnia, not sleeping for two whole days.  Displaying erratic behaviors while at home, alarming husband.                   DISCHARGE CRITERIA:  Ability to meet basic life and health needs Adequate post-discharge living arrangements Improved stabilization in mood, thinking, and/or behavior  PRELIMINARY DISCHARGE PLAN: Placement in alternative living arrangements Return to previous living arrangement  PATIENT/FAMILY INVOLVEMENT: This treatment plan has been presented to and reviewed with the patient, Sylvia Beasley.  The patient and family have been given the opportunity to ask questions and make suggestions.  Cooper LOISE Lent, RN 10/11/2023, 10:36 PM

## 2023-10-11 NOTE — ED Notes (Addendum)
 This RN called pt's husband at her request to ask for her friend, Claudien's phone number that is in pt's phone at home. Husband stated that he didn't think that was a good idea at the time, as it was their neighbor that pt was trying convince of pt's paranoid ideas. Pt was not happy that husband would not give me her number but did not want to talk to him personally.   Husband told this RN that nothing like this manic/paranoia behavior has happened before. Husband stated he will probably visit pt tomorrow.

## 2023-10-11 NOTE — ED Notes (Signed)
 Meal provided

## 2023-10-11 NOTE — ED Notes (Signed)
 Patient is resting comfortably.

## 2023-10-11 NOTE — ED Notes (Addendum)
 Pt personal meds in daily dose pack from personal belongings taken to pharmacy. Daily vitamin pt takes is unable to be verified per pharmacist. Pt husband called and pill bottle requested. Husband verbalized understanding and reports he will attempt to locate bottle.

## 2023-10-11 NOTE — ED Notes (Signed)
 Pt given dinner tray and beverage

## 2023-10-11 NOTE — Plan of Care (Signed)
  Problem: Education: Goal: Knowledge of [facility] Education information/materials will improve 10/11/2023 2232 by Rendell Cooper SAILOR, RN Outcome: Progressing 10/11/2023 2232 by Rendell Cooper SAILOR, RN Outcome: Not Progressing Goal: Verbalization of understanding the information provided will improve 10/11/2023 2232 by Rendell Cooper SAILOR, RN Outcome: Progressing 10/11/2023 2232 by Rendell Cooper SAILOR, RN Outcome: Not Progressing   Problem: Coping: Goal: Ability to verbalize frustrations and anger appropriately will improve 10/11/2023 2232 by Rendell Cooper SAILOR, RN Outcome: Progressing 10/11/2023 2232 by Rendell Cooper SAILOR, RN Outcome: Not Progressing Goal: Ability to demonstrate self-control will improve 10/11/2023 2232 by Rendell Cooper SAILOR, RN Outcome: Progressing 10/11/2023 2232 by Rendell Cooper SAILOR, RN Outcome: Not Progressing   Problem: Education: Goal: Emotional status will improve Outcome: Not Progressing Goal: Mental status will improve Outcome: Not Progressing   Problem: Activity: Goal: Interest or engagement in activities will improve Outcome: Not Progressing Goal: Sleeping patterns will improve Outcome: Not Progressing   Problem: Health Behavior/Discharge Planning: Goal: Identification of resources available to assist in meeting health care needs will improve Outcome: Not Progressing Goal: Compliance with treatment plan for underlying cause of condition will improve Outcome: Not Progressing   Problem: Physical Regulation: Goal: Ability to maintain clinical measurements within normal limits will improve Outcome: Not Progressing   Problem: Safety: Goal: Periods of time without injury will increase Outcome: Not Progressing   Problem: Activity: Goal: Will verbalize the importance of balancing activity with adequate rest periods Outcome: Not Progressing   Problem: Education: Goal: Will be free of psychotic symptoms Outcome: Not Progressing Goal: Knowledge of the prescribed therapeutic  regimen will improve Outcome: Not Progressing   Problem: Coping: Goal: Coping ability will improve Outcome: Not Progressing Goal: Will verbalize feelings Outcome: Not Progressing   Problem: Self-Care: Goal: Ability to participate in self-care as condition permits will improve Outcome: Not Progressing

## 2023-10-11 NOTE — ED Notes (Signed)
 Attempted to call report; staff requested a call back

## 2023-10-11 NOTE — H&P (Addendum)
 Psychiatric Admission Assessment Adult  Patient Identification: Sylvia Beasley MRN:  [id number] Date of Evaluation:  10/13/2023 Chief Complaint:  Psychosis (HCC) [F29]   History of Present Illness: Sylvia Beasley is is a 58 y.o. female with no previous mental health history with symptoms concerning for mania. Given the rapid onset of symptoms would posit that initiation of methotrexate  may have contributed to this presentation as there are rare cases of methotrexate  induced psychosis that present as mania. However, would also be cautious to rule out medical contributors to altered mental status, which is more likely with a rapid onset of psychosis such as in this case. As such, it would be appropriate to order head imaging especially as patient has never previously exhibited a psychiatric decompensation. Given patient's significant lack of insight, she was unable to meaningfully engage in safety planning . Patient is admitted to [facility] unit with Q15 min safety monitoring. Multidisciplinary team approach is offered. Medication management; group/milieu therapy is offered.   Patient is noted to be resting in her room.  Patient reports that she was diagnosed with rheumatoid arthritis and has gone through full surgery in August.  Since then she was home bound and required help from her husband for her ADLs.  Patient reports that she was constantly in pain and was using walker and was getting tired.  She then talks about conflict with her husband who started living her alone in the house and will be gone for the whole day sometimes.  She reports getting started on methotrexate  which made her feel differently.  She is unable to identify her emotions and reports that she started thinking differently.  She then gave an analogy of people being in the jail and deprived of outdoor Freedom.  She reports acting differently when her daughter took her out to mall for shopping and she reports she took her blanket along with  her which she later on felt was awkward socially.  Patient reports she has been isolated and irritable.  She did not acknowledge the symptom of depression but the way she described her emotions so it is more depression.  She reports poor appetite low energy and poor motivation she denies anhedonia and she reports spending time with her daughter is still a great pleasure for her.  She denies SI/HI/intent/plan.  She denies having anxiety or panic attacks.  She denies auditory/visual hallucinations.  She is noted to be making some hyperreligious statements but does report that she met God in 1997 after she had a miscarriage before her daughter was born.  She reports going to church and reading Bible every night along with her daughter.  She reports her daughter goes to [facility] and visits her over the weekends patient reports ongoing conflict with husband and even trying marital counseling sometimes  Provider called patient's husband who informed that patient started displaying more paranoia and isolation in the last few weeks since starting of methotrexate .  He reports on Wednesday things got out of hands where she started talking about helicopters and because coming after her, walking erratically on the streets, arguing and conflict with the husband.  Patient reports that she got into argument with her husband and went to the neighbors house and sat on the porch, called the cops as she wanted her husband to leave the house.  That is how the cops brought her to the emergency room  Total Time spent with patient: 1 hour Sleep  Sleep:Sleep: Poor  Past Psychiatric History:  Psychiatric History:  Information collected from patient and chart  Prev Dx/Sx: None reported Current Psych Provider: None reported Home Meds (current): None reported Previous Med Trials: None reported Therapy: None reported  Prior Psych Hospitalization: Denies Prior Self Harm: Denies Prior Violence: Denies  Family Psych History:  Denies Family Hx suicide: Denies  Social History:   Educational Hx: High school Occupational Hx: On disability Legal Hx: Denies Living Situation: With husband and has 61 year old daughter Spiritual Hx: Very spiritual Access to weapons/lethal means: Denies  Substance History Alcohol: Denies Tobacco: Denies Illicit drugs: Denies Prescription drug abuse: Denies Rehab hx: Denies Is the patient at risk to self? No.  Has the patient been a risk to self in the past 6 months? No.  Has the patient been a risk to self within the distant past? No.  Is the patient a risk to others? No.  Has the patient been a risk to others in the past 6 months? No.  Has the patient been a risk to others within the distant past? No.   [place] Scale:  Flowsheet Row Admission (Current) from 10/11/2023 in [facility] BEHAVIORAL MEDIC[facility] ED from 10/10/2023 in [facility] Emergency Department at [facility] Admission (Discharged) from 08/22/2023 in [facility] SURGICAL CENTER PERIOP  C-SSRS RISK CATEGORY No Risk No Risk No Risk     Past Medical History:  Past Medical History:  Diagnosis Date   Allergy    Anemia    Arthritis    rheumatiod   Hypertension    Pre-diabetes     Past Surgical History:  Procedure Laterality Date   BREAST BIOPSY     BREAST CYST ASPIRATION Left    x 2   COLONOSCOPY WITH PROPOFOL  N/A 09/22/2021   Procedure: COLONOSCOPY WITH PROPOFOL ;  Surgeon: Therisa Bi, MD;  Location: [facility] ENDOSCOPY;  Service: Gastroenterology;  Laterality: N/A;   EYE SURGERY Left 1980   stye removal   FOOT SURGERY Left 08/22/2023   HALLUX VALGUS AKIN Left 08/22/2023   Procedure: CORRECTION, HALLUX VALGUS, WITH AKIN OSTEOTOMY;  Surgeon: Ashley Soulier, DPM;  Location: [facility];  Service: Orthopedics/Podiatry;  Laterality: Left;   HALLUX VALGUS LAPIDUS Left 08/22/2023   Procedure: ROMAYNE LOOK;  Surgeon: Ashley Soulier, DPM;  Location: [facility];  Service:  Orthopedics/Podiatry;  Laterality: Left;   TUBAL LIGATION  2007   Family History:  Family History  Problem Relation Age of Onset   Kidney disease Mother        died of ESRD   Diabetes Mother    Arthritis Father    Dementia Father    Asthma Sister    Breast cancer Sister 85   Glaucoma Sister    Diabetes Sister    Diabetes Sister        s/p BKA   Throat cancer Paternal Grandmother    Pancreatic cancer Paternal Uncle    Colon cancer Neg Hx     Social History:  Social History   Substance and Sexual Activity  Alcohol Use Never     Social History   Substance and Sexual Activity  Drug Use No      Allergies:   Allergies  Allergen Reactions   Shellfish Allergy Shortness Of Breath   Zyrtec [Cetirizine] Shortness Of Breath   Ace Inhibitors Other (See Comments)   Ipratropium Bromide Other (See Comments)   Milk-Related Compounds    Fexofenadine Rash    Allegra   Rofecoxib Rash    Vioxx   Lab Results:  Results for orders  placed or performed during the hospital encounter of 10/11/23 (from the past 48 hours)  Glucose, capillary     Status: Abnormal   Collection Time: 10/11/23  7:53 PM  Result Value Ref Range   Glucose-Capillary 120 (H) 70 - 99 mg/dL    Comment: Glucose reference range applies only to samples taken after fasting for at least 8 hours.    Blood Alcohol level:  Lab Results  Component Value Date   [facility] <15 10/10/2023    Metabolic Disorder Labs:  Lab Results  Component Value Date   HGBA1C 5.9 (H) 08/02/2023   No results found for: PROLACTIN Lab Results  Component Value Date   CHOL 179 08/02/2023   TRIG 68 08/02/2023   HDL 60 08/02/2023   CHOLHDL 3.0 08/02/2023   LDLCALC 106 (H) 08/02/2023   LDLCALC 119 (H) 08/21/2022    Current Medications: Current Facility-Administered Medications  Medication Dose Route Frequency Provider Last Rate Last Admin   acetaminophen  (TYLENOL ) tablet 650 mg  650 mg Oral Q6H PRN Trudy Carwin, NP       alum & mag  hydroxide-simeth (MAALOX/MYLANTA) 200-200-20 MG/5ML suspension 30 mL  30 mL Oral Q4H PRN Trudy Carwin, NP       folic acid  (FOLVITE ) tablet 1 mg  1 mg Oral Daily Ashrith Sagan, MD   1 mg at 10/13/23 9074   hydrALAZINE (APRESOLINE) tablet 25 mg  25 mg Oral Q8H PRN Marrie Chandra, MD   25 mg at 10/12/23 9682   losartan  (COZAAR ) tablet 50 mg  50 mg Oral Daily Catrena Vari, MD   50 mg at 10/13/23 0925   magnesium hydroxide (MILK OF MAGNESIA) suspension 30 mL  30 mL Oral Daily PRN Trudy Carwin, NP       metoprolol  succinate (TOPROL -XL) 24 hr tablet 25 mg  25 mg Oral Daily Najla Aughenbaugh, MD   25 mg at 10/13/23 0925   OLANZapine (ZYPREXA) tablet 5 mg  5 mg Oral BID Reeda Soohoo, MD   5 mg at 10/13/23 1238   OLANZapine zydis (ZYPREXA) disintegrating tablet 5 mg  5 mg Oral TID PRN Trudy Carwin, NP   5 mg at 10/11/23 1956   pyridOXINE (VITAMIN B6) tablet 25 mg  25 mg Oral Daily Joselle Deeds, MD   25 mg at 10/13/23 9074   traZODone  (DESYREL ) tablet 50 mg  50 mg Oral QHS McLauchlin, Angela, NP   50 mg at 10/12/23 2110   PTA Medications: Medications Prior to Admission  Medication Sig Dispense Refill Last Dose/Taking   ACCU-CHEK GUIDE TEST test strip as directed.      aspirin  EC 81 MG tablet Take 1 tablet (81 mg total) by mouth in the morning and at bedtime. Swallow whole. (Patient not taking: Reported on 10/10/2023) 60 tablet 12    BLACK COHOSH COMPOUND PO Take 1 tablet by mouth at bedtime.      Cats Claw, Uncaria Tomentosa, (CATS CLAW PO) Take 1 tablet by mouth daily.      folic acid  (FOLVITE ) 1 MG tablet Take 1 mg by mouth daily.      losartan  (COZAAR ) 50 MG tablet TAKE 1 TABLET BY MOUTH EVERY DAY 90 tablet 1    meloxicam  (MOBIC ) 15 MG tablet Take 1 tablet (15 mg total) by mouth daily. 30 tablet 1    methotrexate  (RHEUMATREX) 2.5 MG tablet Take 12.5 mg by mouth once a week. Tuesday      metoprolol  succinate (TOPROL -XL) 25 MG 24 hr tablet TAKE 1 TABLET (25  MG TOTAL) BY MOUTH DAILY. 90  tablet 1    Moringa 500 MG CAPS Take 1,500 mg by mouth.      pyridOXINE (VITAMIN B6) 25 MG tablet Take 25 mg by mouth daily.      traZODone  (DESYREL ) 50 MG tablet Take 0.5-1 tablets (25-50 mg total) by mouth at bedtime as needed for sleep. 30 tablet 3    triamcinolone cream (KENALOG) 0.5 % Apply topically. (Patient not taking: Reported on 10/10/2023)      Turmeric (QC TUMERIC COMPLEX) 500 MG CAPS Take 1,000 mg by mouth.       Psychiatric Specialty Exam:  Presentation  General Appearance: Bizarre  Eye Contact:Fair  Speech:Normal Rate  Speech Volume:Decreased    Mood and Affect  Mood:Labile; Irritable  Affect:Labile   Thought Process  Thought Processes:Disorganized  Descriptions of Associations:No data recorded Orientation:Full (Time, Place and Person)  Thought Content:Illogical; Paranoid Ideation  Hallucinations:Hallucinations: None  Ideas of Reference:Paranoia  Suicidal Thoughts:Suicidal Thoughts: No  Homicidal Thoughts:Homicidal Thoughts: No   Sensorium  Memory:Immediate Fair; Recent Fair  Judgment:Impaired  Insight:Shallow   Executive Functions  Concentration:Poor  Attention Span:Poor  Recall:Fair  Fund of Knowledge:Fair  Language:Fair   Psychomotor Activity  Psychomotor Activity:Psychomotor Activity: Normal   Assets  Assets:Communication Skills; Social Support    Musculoskeletal: Strength & Muscle Tone: within normal limits Gait & Station: normal  Physical Exam: Physical Exam Vitals and nursing note reviewed.  HENT:     Head: Normocephalic.     Nose: Nose normal.  Cardiovascular:     Rate and Rhythm: Normal rate.     Pulses: Normal pulses.  Pulmonary:     Effort: Pulmonary effort is normal.  Neurological:     Mental Status: She is alert.    Review of Systems  Constitutional: Negative.   HENT: Negative.    Eyes: Negative.   Cardiovascular: Negative.   Skin: Negative.    Blood pressure 105/75, pulse (!) 118,  temperature (!) 97.2 F (36.2 C), resp. rate 18, height 5' 3 (1.6 m), weight 58.5 kg, last menstrual period 05/22/2015, SpO2 100%. Body mass index is 22.85 kg/m.  Principal Diagnosis: Psychosis (HCC) Diagnosis:  Principal Problem:   Psychosis (HCC)   Clinical Decision Making: Patient is currently admitted for worsening paranoia and disorganized behaviors in the community in the context of possible reaction to her outpatient medications methotrexate .  Patient needs to be monitored in a safer environment.  Treatment Plan Summary:  Safety and Monitoring:             -- involuntary admission to inpatient psychiatric unit for safety, stabilization and treatment             -- Daily contact with patient to assess and evaluate symptoms and progress in treatment             -- Patient's case to be discussed in multi-disciplinary team meeting             -- Observation Level: q15 minute checks             -- Vital signs:  q12 hours             -- Precautions: suicide, elopement, and assault   2. Psychiatric Diagnoses and Treatment:                   -- The risks/benefits/side-effects/alternatives to this medication were discussed in detail with the patient and time was given for questions. The patient consents to medication trial.                --  Metabolic profile and EKG monitoring obtained while on an atypical antipsychotic (BMI: Lipid Panel: HbgA1c: QTc:)              -- Encouraged patient to participate in unit milieu and in scheduled group therapies                            3. Medical Issues Being Addressed:      4. Discharge Planning:              -- Social work and case management to assist with discharge planning and identification of hospital follow-up needs prior to discharge             -- Estimated LOS: 5-7 days             -- Discharge Concerns: Need to establish a safety plan; Medication compliance and effectiveness             -- Discharge Goals: Return home with  outpatient referrals follow ups  Physician Treatment Plan for Primary Diagnosis: Psychosis (HCC) Long Term Goal(s): Improvement in symptoms so as ready for discharge  Short Term Goals: Ability to identify changes in lifestyle to reduce recurrence of condition will improve, Ability to verbalize feelings will improve, Ability to disclose and discuss suicidal ideas, Ability to demonstrate self-control will improve, and Ability to identify and develop effective coping behaviors will improve  Physician Treatment Plan for Secondary Diagnosis: Principal Problem:   Psychosis (HCC)  Long Term Goal(s): Improvement in symptoms so as ready for discharge  Short Term Goals: Ability to identify changes in lifestyle to reduce recurrence of condition will improve, Ability to verbalize feelings will improve, Ability to disclose and discuss suicidal ideas, Ability to demonstrate self-control will improve, and Ability to identify and develop effective coping behaviors will improve  I certify that inpatient services furnished can reasonably be expected to improve the patient's condition.    Rejeana Fadness, MD 10/11/20255:50 PM

## 2023-10-11 NOTE — BH Assessment (Signed)
 PENDING DISCHARGES FROM [facility] ON 10/11/23, DAY SHIFT AC TO COORDINATE  Patient is to be admitted to [facility] .  Attending Physician will be Dr. Layne.    ER staff is aware of the admission: Carlene ER Secretary   Dr. Floy, ER MD  Luke Patient's Nurse

## 2023-10-11 NOTE — ED Notes (Signed)
 IVC/  PENDING  PLACEMENT

## 2023-10-11 NOTE — ED Notes (Signed)
 Pts husband brought home medication and juice to the ED. This RN tried taking these things to pharmacy for them to dispense. Pharmacy stated that this RN should take the medication and juice to [facility] and have them fill out paperwork and then send everything to pharmacy. Medication and juice sent with [facility], ED tech, to [facility] and instructed her to give to pts primary RN. This was done at 7:15 pm on 10/11/23 but can only be charted at the time of the note before pts discharge.

## 2023-10-11 NOTE — Progress Notes (Signed)
 Admission skin assessment  Pt was admitted to [facility] unit @ 1845, skin assessed and found unremarkable except for a recent surgical scar to left foot and it is healed. Surgical date was August 22, 2023.

## 2023-10-11 NOTE — ED Notes (Signed)
 Patient sleeping

## 2023-10-12 MED ORDER — TURMERIC 500 MG PO CAPS: 1000.0000 mg | ORAL_CAPSULE | Freq: Every day | ORAL | Status: DC

## 2023-10-12 MED ORDER — MORINGA 500 MG PO CAPS: 1500.0000 mg | ORAL_CAPSULE | Freq: Every day | ORAL | Status: DC

## 2023-10-12 MED ORDER — CLONIDINE HCL 0.1 MG PO TABS
0.1000 mg | ORAL_TABLET | Freq: Once | ORAL | Status: AC
  Administered 2023-10-12: 0.1 mg via ORAL
  Filled 2023-10-12: qty 1

## 2023-10-12 MED ORDER — FOLIC ACID 1 MG PO TABS
1.0000 mg | ORAL_TABLET | Freq: Every day | ORAL | Status: DC
  Administered 2023-10-12 – 2023-10-18 (×7): 1 mg via ORAL
  Filled 2023-10-12 (×7): qty 1

## 2023-10-12 NOTE — Plan of Care (Signed)
   Problem: Education: Goal: Emotional status will improve Outcome: Not Progressing Goal: Mental status will improve Outcome: Not Progressing

## 2023-10-12 NOTE — BHH Counselor (Signed)
 Adult Comprehensive Assessment  Patient ID: Natoya Viscomi, female   DOB: 1965-09-11, 58 y.o.   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 Relationships: Water quality scientist / Lack of resources (include bankruptcy): None reported Housing / Lack of housing: None reported Physical health (include injuries & life threatening diseases): Pt reports she has rheumatoid arthritis and pre-diabetes Social relationships: I'm a Haematologist Substance abuse: None reported Bereavement / Loss: None reported  Living/Environment/Situation:  Living Arrangements: Spouse/significant other Living conditions (as described by patient or guardian): Pt does not report Who else lives in the home?: Pt reports that she lives with her husband How long has patient lived in current situation?: Pt does not report What is atmosphere in current home: Other (Comment) (tense)  Family History:  Marital status: Married Number of Years Married:  (Unable to assess) What types of issues is patient dealing with in the relationship?: Pt reports she is working on forgiving her husband Additional relationship information: None reported Are you sexually active?:  (UTA) What is your sexual orientation?: UTA Has your sexual activity been affected by drugs, alcohol, medication, or emotional stress?: UTA Does patient have children?: Yes How many children?: 2 How is patient's relationship with their children?: Pt reports she has a tense currently  Childhood History:  By whom was/is the patient raised?: Father Additional childhood history information: None  reported Description of patient's relationship with caregiver when they were a child: No emotional contact from my father,  my mother gave me up as a child Patient's description of current relationship with people who raised him/her: Pt reports they are deceased How were you disciplined when you got in trouble as a child/adolescent?: UTA Does patient have siblings?: Yes Number of Siblings: 8 Description of patient's current relationship with siblings: Pt reports she has multiple half siblings and 1 full sibling but she treats them as equals Did patient suffer any verbal/emotional/physical/sexual abuse as a child?: Yes (No sexual but everything else) Did patient suffer from severe childhood neglect?: No Has patient ever been sexually abused/assaulted/raped as an adolescent or adult?: No Was the patient ever a victim of a crime or a disaster?: No Witnessed domestic violence?: No Has patient been affected by domestic violence as an adult?: No  Education:  Highest grade of school patient has completed: Copywriter, advertising- [employer] Currently a Consulting civil engineer?: No Learning disability?: No  Employment/Work Situation:   Employment Situation: Employed Where is Patient Currently Employed?: 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?: No  Financial Resources:   Financial resources: Income from employment, Income from spouse Does patient have a representative payee or guardian?: No  Alcohol/Substance Abuse:   What has been your use of drugs/alcohol within the last 12 months?: None reported If  attempted suicide, did drugs/alcohol  play a role in this?: No Alcohol/Substance Abuse Treatment Hx: Denies past history If yes, describe treatment: N/A Has alcohol/substance abuse ever caused legal problems?: No  Social Support System:   Forensic psychologist System: None Describe Community Support System: Pt does not report any supports Type of faith/religion: Baptist How does patient's faith help to cope with current illness?: Pray to God and he talks to me back in volume  Leisure/Recreation:   Do You Have Hobbies?: No  Strengths/Needs:   What is the patient's perception of their strengths?: None reported Patient states they can use these personal strengths during their treatment to contribute to their recovery: N/A Patient states these barriers may affect/interfere with their treatment: None reported Patient states these barriers may affect their return to the community: None reported Other important information patient would like considered in planning for their treatment: None reported  Discharge Plan:   Currently receiving community mental health services: No Patient states concerns and preferences for aftercare planning are: Pt declines therapy and psychiatry at this time Patient states they will know when they are safe and ready for discharge when: I know now Does patient have access to transportation?: Yes (Pt reports her daughter would pick her up) Does patient have financial barriers related to discharge medications?: No Patient description of barriers related to discharge medications: None reported Will patient be returning to same living situation after discharge?: Yes  Summary/Recommendations:   Summary and Recommendations (to be completed by the evaluator): Patient is a 58 year-old female from [place] [place] [facility] According to ED notes, Pt to ED with BPD IVC, papers state pt is wandering around neighborhood having mental break and is danger to  self. States has not slept in 2 days because daughter is at [facility] state and wants to be with her. Denies SI/HI. States I've had military jets following me, my phone survellienced, people following me, my card has been declined, I'm not taking a shot, I just need to go to a high school in [place] to be with my children and keep them safe. Upon assessment today, pt reports she came to the hospital because of stressors within her marriage and her family. Pt reports that relationships with her husband and children are tense. Pt declines therapy and psychiatry services at this time. Pt denies any specific psychosocial stressors. Pt's primary diagnosis is Psychosis.  Recommendations include: crisis stabilization, therapeutic milieu, encourage group attendance and participation, medication management for mood stabilization and development of comprehensive mental wellness/sobriety plan.  Lum JONETTA Croft. 10/12/2023

## 2023-10-12 NOTE — Group Note (Signed)
 Date:  10/12/2023 Time:  1:31 PM  Group Topic/Focus:  Goals Group:   The focus of this group is to help patients establish daily goals to achieve during treatment and discuss how the patient can incorporate goal setting into their daily lives to aide in recovery.    Participation Level:  Active  Participation Quality:  Appropriate  Affect:  Appropriate  Cognitive:  Alert  Insight: Appropriate  Engagement in Group:  Engaged  Modes of Intervention:  Discussion  Additional Comments:    Kyiesha Millward l Refael Fulop 10/12/2023, 1:31 PM

## 2023-10-12 NOTE — Group Note (Signed)
 Recreation Therapy Group Note   Group Topic:Stress Management  Group Date: 10/12/2023 Start Time: 1400 End Time: 1500 Facilitators: Celestia Jeoffrey BRAVO, LRT, CTRS Location: Courtyard  Group Description: Stress Jenga. LRT and pts played games of Jenga. LRT prompted group discussion on the physical signs and symptoms of stress, similar to the feeling when you're playing Jenga and trying to remove a wooden block from the stack without it all collapsing. LRT and pt discussed the physical and mental signs of stress, as well as coping skills to manage them.   Goal Area(s) Addressed: Patient will identify physical symptoms of stress. Patient will identify coping skills for stress. Patient will build frustration tolerance skills.  Patient will increase communication.    Affect/Mood: N/A   Participation Level: Did not attend    Clinical Observations/Individualized Feedback: Patient did not attend group.   Plan: Continue to engage patient in RT group sessions 2-3x/week.   Jeoffrey BRAVO Celestia, LRT, CTRS 10/12/2023 4:54 PM

## 2023-10-12 NOTE — Progress Notes (Signed)
 PHARMACIST - PHYSICIAN ORDER COMMUNICATION  CONCERNING: P&T Medication Policy on Herbal Medications  DESCRIPTION:  This patient's order for:  Moringa Capsules and Tumeric capsules  has been noted.  This product(s) is classified as an "herbal" or natural product. Due to a lack of definitive safety studies or FDA approval, nonstandard manufacturing practices, plus the potential risk of unknown drug-drug interactions while on inpatient medications, the Pharmacy and Therapeutics Committee does not permit the use of "herbal" or natural products of this type within [facility].   ACTION TAKEN: The pharmacy department is unable to verify this order at this time and your patient has been informed of this safety policy. Please reevaluate patient's clinical condition at discharge and address if the herbal or natural product(s) should be resumed at that time.

## 2023-10-12 NOTE — Progress Notes (Signed)
   10/11/23 2200  Psych Admission Type (Psych Patients Only)  Admission Status Involuntary  Psychosocial Assessment  Patient Complaints Insomnia  Eye Contact Fair  Facial Expression Animated  Affect Labile  Speech Unremarkable  Interaction Assertive  Motor Activity Restless  Appearance/Hygiene Unremarkable;In scrubs  Behavior Characteristics Cooperative;Anxious;Irritable  Mood Anxious  Thought Process  Coherency WDL  Content Blaming others  Delusions Paranoid  Perception WDL  Hallucination None reported or observed  Judgment Impaired  Confusion WDL  Danger to Self  Current suicidal ideation? Denies  Danger to Others  Danger to Others None reported or observed    Estimated Sleeping Duration (Last 24 Hours): 3.75-4.00 hours

## 2023-10-12 NOTE — Group Note (Signed)
 Physical/Occupational Therapy Group Note  Group Topic: Neurographic Art  Group Date: 10/12/2023 Start Time: 1310 End Time: 1410 Facilitators: Clive Warren CROME, OT   Group Description: Group participated with Neurographic art activity, using watercolor paints to facilitate creative expression and meditation/relaxation for each individual.  Incorporated bimanual coordination, mental focus, emotional processing, task/command following and relaxation techniques as appropriate.  Patients engaged socially with therapist and other group participants throughout session. Allowed to ask questions as appropriate, and encouraged to identify ways they could use/share their creations with themselves and others.  Therapeutic Goal(s):  Demonstrate ability to independently manipulate utensils required to participate with and complete activity. Demonstrate ability to cognitively focus on task and follow commands necessary for completion. Demonstrate use of art as an outlet for emotional processing and expression. Identify and demonstrate importance of relaxation, neural calming and meditation for improved participation with life groups.  Individual Participation: Pt did not attend.   Participation Level: Did not attend   Participation Quality:   Behavior:   Speech/Thought Process:   Affect/Mood:   Insight:   Judgement:   Modes of Intervention:   Patient Response to Interventions:    Plan: Continue to engage patient in PT/OT groups 1 - 2x/week.  Gabrial Domine R., MPH, MS, OTR/L ascom [phone] 10/12/23, 4:15 PM

## 2023-10-12 NOTE — Progress Notes (Signed)
 Patient arrived to the unit before the arrival of the oncoming 7p shift, accepted by the prior shift. Patient is a 58 y.o. AAF transferred from the emergency department. AxOx4. Admitted on a IVC status for psychosis. Gait is slow and steady with a left leg limp.  Patient reported that she was BIB the police after she was seen at a neighbor's house "asking them to help her drink some water and open up food packages". Patient has not slept in 2 days. She states, that he husband has also voiced that she has been behaving "oddly" lately. Patient denies having a mental history or taking any medications for behavioral health in the past. She denied SI/HI and AH/VH. She voiced the delusion of having cancer and taking chemotherapy medication which is causing her not to sleep, although medical records do not support this diagnosis. She reported that she lives home with her husband but wants to move into the dormitory with her daughter who attends [employer] [address], studying for medicine. Thoughts are presented disorganized and speech is tangential. She gets easily upset, during 1:1 conversation. When patient arrived onto the unit her BP and HR were both elevated. Prior shift consulted the receiving MD, who ordered PRN hydralazine 25mg  P.O. Q-8hr. Medication was administered at 1956. Since arrival onto the unit, patient has displayed continuously restless/manic/ agitated behaviors, even tearful and yelling at staff at times. She was offered PRN Zyprexa 5mg  at 1956 and later trazodone  50mg  at 2255 after contacting the Psych on-call. Vital signs were re-assessed, BP: 190/98 HR 115 at 2417. At this time she c/o racing heart sensation, denies being in any pain. BP re-assessed at 2440 (BP: 176/104 HR: 104). All BP/HR taken manually. She then demanded to go off the unit back to the E.D. where she was originally transferred from, with the belief that they will allow her to go home from there. She was re-educated and informed  that she is under IVC admission commitment. Psych on-call again contacted, received a NOW order for clonidine 0.1mg  P.O. at 0130, which was administered. Patient encouraged to rest, will continue to monitor the patient. All orders maintained as written and NCP/ ITP both initiated. Belongings searched and documented on the personal belongings inventory sheet, appropriate valuable items stored in locker 15. Patient oriented to the unit and daily expectations. Offered a evening snack in which she did consume.

## 2023-10-12 NOTE — BH IP Treatment Plan (Signed)
 Interdisciplinary Treatment and Diagnostic Plan Update  10/12/2023 Time of Session: 10:35 AM  Sylvia Beasley MRN: [id number]  Principal Diagnosis: Psychosis [facility])  Secondary Diagnoses: Principal Problem:   Psychosis (HCC)   Current Medications:  Current Facility-Administered Medications  Medication Dose Route Frequency Provider Last Rate Last Admin   acetaminophen  (TYLENOL ) tablet 650 mg  650 mg Oral Q6H PRN Trudy Carwin, NP       alum & mag hydroxide-simeth (MAALOX/MYLANTA) 200-200-20 MG/5ML suspension 30 mL  30 mL Oral Q4H PRN Trudy Carwin, NP       hydrALAZINE (APRESOLINE) tablet 25 mg  25 mg Oral Q8H PRN Jadapalle, Sree, MD   25 mg at 10/12/23 9682   losartan  (COZAAR ) tablet 50 mg  50 mg Oral Daily Jadapalle, Sree, MD   50 mg at 10/12/23 1032   magnesium hydroxide (MILK OF MAGNESIA) suspension 30 mL  30 mL Oral Daily PRN Trudy Carwin, NP       metoprolol  succinate (TOPROL -XL) 24 hr tablet 25 mg  25 mg Oral Daily Jadapalle, Sree, MD   25 mg at 10/12/23 1032   OLANZapine zydis (ZYPREXA) disintegrating tablet 5 mg  5 mg Oral TID PRN Trudy Carwin, NP   5 mg at 10/11/23 1956   pyridOXINE (VITAMIN B6) tablet 25 mg  25 mg Oral Daily Jadapalle, Sree, MD   25 mg at 10/12/23 1032   traZODone  (DESYREL ) tablet 50 mg  50 mg Oral QHS McLauchlin, Angela, NP   50 mg at 10/11/23 2255   PTA Medications: Medications Prior to Admission  Medication Sig Dispense Refill Last Dose/Taking   ACCU-CHEK GUIDE TEST test strip as directed.      aspirin  EC 81 MG tablet Take 1 tablet (81 mg total) by mouth in the morning and at bedtime. Swallow whole. (Patient not taking: Reported on 10/10/2023) 60 tablet 12    BLACK COHOSH COMPOUND PO Take 1 tablet by mouth at bedtime.      Cats Claw, Uncaria Tomentosa, (CATS CLAW PO) Take 1 tablet by mouth daily.      folic acid  (FOLVITE ) 1 MG tablet Take 1 mg by mouth daily.      losartan  (COZAAR ) 50 MG tablet TAKE 1 TABLET BY MOUTH EVERY DAY 90 tablet 1    meloxicam   (MOBIC ) 15 MG tablet Take 1 tablet (15 mg total) by mouth daily. 30 tablet 1    methotrexate  (RHEUMATREX) 2.5 MG tablet Take 12.5 mg by mouth once a week. Tuesday      metoprolol  succinate (TOPROL -XL) 25 MG 24 hr tablet TAKE 1 TABLET (25 MG TOTAL) BY MOUTH DAILY. 90 tablet 1    Moringa 500 MG CAPS Take 1,500 mg by mouth.      pyridOXINE (VITAMIN B6) 25 MG tablet Take 25 mg by mouth daily.      traZODone  (DESYREL ) 50 MG tablet Take 0.5-1 tablets (25-50 mg total) by mouth at bedtime as needed for sleep. 30 tablet 3    triamcinolone cream (KENALOG) 0.5 % Apply topically. (Patient not taking: Reported on 10/10/2023)      Turmeric (QC TUMERIC COMPLEX) 500 MG CAPS Take 1,000 mg by mouth.       Patient Stressors: Health problems   Marital or family conflict    Patient Strengths: Printmaker for treatment/growth  Supportive family/friends   Treatment Modalities: Medication Management, Group therapy, Case management,  1 to 1 session with clinician, Psychoeducation, Recreational therapy.   Physician Treatment Plan for Primary Diagnosis: Psychosis [facility]) Long Term  Goal(s):     Short Term Goals:    Medication Management: Evaluate patient's response, side effects, and tolerance of medication regimen.  Therapeutic Interventions: 1 to 1 sessions, Unit Group sessions and Medication administration.  Evaluation of Outcomes: Not Progressing  Physician Treatment Plan for Secondary Diagnosis: Principal Problem:   Psychosis (HCC)  Long Term Goal(s):     Short Term Goals:       Medication Management: Evaluate patient's response, side effects, and tolerance of medication regimen.  Therapeutic Interventions: 1 to 1 sessions, Unit Group sessions and Medication administration.  Evaluation of Outcomes: Not Progressing   RN Treatment Plan for Primary Diagnosis: Psychosis (HCC) Long Term Goal(s): Knowledge of disease and therapeutic regimen to maintain health will improve  Short  Term Goals: Ability to remain free from injury will improve, Ability to verbalize frustration and anger appropriately will improve, Ability to demonstrate self-control, Ability to participate in decision making will improve, Ability to verbalize feelings will improve, Ability to disclose and discuss suicidal ideas, Ability to identify and develop effective coping behaviors will improve, and Compliance with prescribed medications will improve  Medication Management: RN will administer medications as ordered by provider, will assess and evaluate patient's response and provide education to patient for prescribed medication. RN will report any adverse and/or side effects to prescribing provider.  Therapeutic Interventions: 1 on 1 counseling sessions, Psychoeducation, Medication administration, Evaluate responses to treatment, Monitor vital signs and CBGs as ordered, Perform/monitor CIWA, COWS, AIMS and Fall Risk screenings as ordered, Perform wound care treatments as ordered.  Evaluation of Outcomes: Not Progressing   LCSW Treatment Plan for Primary Diagnosis: Psychosis (HCC) Long Term Goal(s): Safe transition to appropriate next level of care at discharge, Engage patient in therapeutic group addressing interpersonal concerns.  Short Term Goals: Engage patient in aftercare planning with referrals and resources, Increase social support, Increase ability to appropriately verbalize feelings, Increase emotional regulation, Facilitate acceptance of mental health diagnosis and concerns, Facilitate patient progression through stages of change regarding substance use diagnoses and concerns, Identify triggers associated with mental health/substance abuse issues, and Increase skills for wellness and recovery  Therapeutic Interventions: Assess for all discharge needs, 1 to 1 time with Social worker, Explore available resources and support systems, Assess for adequacy in community support network, Educate family and  significant other(s) on suicide prevention, Complete Psychosocial Assessment, Interpersonal group therapy.  Evaluation of Outcomes: Not Progressing   Progress in Treatment: Attending groups: Yes. and No. Participating in groups: Yes. and No. Taking medication as prescribed: Yes. Toleration medication: Yes. Family/Significant other contact made: No, will contact:  CSW will contact if given permission  Patient understands diagnosis: Yes. Discussing patient identified problems/goals with staff: Yes. Medical problems stabilized or resolved: Yes. Denies suicidal/homicidal ideation: Yes. Issues/concerns per patient self-inventory: No. Other: None   New problem(s) identified: No, Describe:  None identified   New Short Term/Long Term Goal(s):  elimination of symptoms of psychosis, medication management for mood stabilization; elimination of SI thoughts; development of comprehensive mental wellness plan.   Patient Goals:  Just to get clear in my head  Discharge Plan or Barriers: CSW will assist with appropriate discharge planning   Reason for Continuation of Hospitalization: Medication stabilization  Estimated Length of Stay: 1 to 7 days   Last 3 [place] Suicide Severity Risk Score: Flowsheet Row Admission (Current) from 10/11/2023 in [facility] BE[facility]AV[facility]AL [facility] ED from 10/10/2023 in [facility] Emergency Department at [facility] Admission (Discharged) from 08/22/2023 in [facility] SURGICAL CENTER  PERIOP  C-SSRS RISK CATEGORY No Risk No Risk No Risk    Last PHQ 2/9 Scores:    08/02/2023    8:30 AM 05/18/2023    1:27 PM 03/05/2023   11:14 AM  Depression screen PHQ 2/9  Decreased Interest 0 0 0  Down, Depressed, Hopeless 0 0 0  PHQ - 2 Score 0 0 0  Altered sleeping  1   Tired, decreased energy  0   Change in appetite  0   Feeling bad or failure about yourself   0   Trouble concentrating  0   Moving slowly or fidgety/restless  0   Suicidal thoughts  0   PHQ-9  Score  1   Difficult doing work/chores  Not difficult at all     Scribe for Treatment Team: Sylvia Beasley, LCSWA 10/12/2023 11:31 AM

## 2023-10-13 MED ORDER — TRAZODONE HCL 50 MG PO TABS
50.0000 mg | ORAL_TABLET | Freq: Once | ORAL | Status: AC | PRN
  Administered 2023-10-13: 50 mg via ORAL
  Filled 2023-10-13: qty 1

## 2023-10-13 MED ORDER — OLANZAPINE 5 MG PO TABS
5.0000 mg | ORAL_TABLET | Freq: Two times a day (BID) | ORAL | Status: DC
  Administered 2023-10-13 – 2023-10-14 (×2): 5 mg via ORAL
  Filled 2023-10-13 (×3): qty 1

## 2023-10-13 NOTE — Care Plan (Signed)
 SATURDAY 10/13/23  Patient stated she called her insurance company this morning and they have told her she can leave at any time.  Patient state she will wait on the psychiatrist to come around and discharge her because she wants to do it the right way. Husband called the unit and asked that the psychiatrist call to speak with him about his wife.  [phone]

## 2023-10-13 NOTE — Group Note (Signed)
 Date:  10/13/2023 Time:  11:39 AM  Group Topic/Focus:  Goals Group:   The focus of this group is to help patients establish daily goals to achieve during treatment and discuss how the patient can incorporate goal setting into their daily lives to aide in recovery.    Participation Level:  Active  Participation Quality:  Appropriate  Affect:  Appropriate  Cognitive:  Alert  Insight: Good  Engagement in Group:  Engaged  Modes of Intervention:  Activity  Additional Comments:     Maglione,Rachna Schonberger E 10/13/2023, 11:39 AM

## 2023-10-13 NOTE — Progress Notes (Signed)
   10/13/23 1300  Psych Admission Type (Psych Patients Only)  Admission Status Involuntary  Psychosocial Assessment  Patient Complaints Irritability  Eye Contact Fair  Facial Expression Animated  Affect Labile  Speech Logical/coherent  Interaction Assertive  Motor Activity Other (Comment) (WNL)  Appearance/Hygiene In scrubs  Behavior Characteristics Appropriate to situation  Mood Preoccupied  Thought Process  Coherency WDL  Content WDL  Delusions None reported or observed  Perception WDL  Hallucination None reported or observed  Judgment Impaired  Confusion WDL  Danger to Self  Current suicidal ideation? Denies  Danger to Others  Danger to Others None reported or observed

## 2023-10-13 NOTE — Plan of Care (Signed)
  Problem: Education: Goal: Knowledge of [facility] Education information/materials will improve Outcome: Progressing Goal: Emotional status will improve Outcome: Progressing Goal: Mental status will improve Outcome: Progressing Goal: Verbalization of understanding the information provided will improve Outcome: Progressing   Problem: Activity: Goal: Interest or engagement in activities will improve Outcome: Progressing Goal: Sleeping patterns will improve Outcome: Progressing   Problem: Coping: Goal: Ability to verbalize frustrations and anger appropriately will improve Outcome: Progressing Goal: Ability to demonstrate self-control will improve Outcome: Progressing   Problem: Health Behavior/Discharge Planning: Goal: Identification of resources available to assist in meeting health care needs will improve Outcome: Progressing Goal: Compliance with treatment plan for underlying cause of condition will improve Outcome: Progressing   Problem: Physical Regulation: Goal: Ability to maintain clinical measurements within normal limits will improve Outcome: Progressing   Problem: Safety: Goal: Periods of time without injury will increase Outcome: Progressing   Problem: Activity: Goal: Will verbalize the importance of balancing activity with adequate rest periods Outcome: Progressing   Problem: Education: Goal: Will be free of psychotic symptoms Outcome: Progressing Goal: Knowledge of the prescribed therapeutic regimen will improve Outcome: Progressing   Problem: Coping: Goal: Coping ability will improve Outcome: Progressing Goal: Will verbalize feelings Outcome: Progressing   Problem: Self-Care: Goal: Ability to participate in self-care as condition permits will improve Outcome: Progressing

## 2023-10-13 NOTE — Progress Notes (Signed)
 [facility] MD Progress Note  10/13/2023 5:46 PM Sylvia Beasley  MRN:  [id number]  Sylvia Beasley is is a 58 y.o. female with no previous mental health history with symptoms concerning for mania. Given the rapid onset of symptoms would posit that initiation of methotrexate  may have contributed to this presentation as there are rare cases of methotrexate  induced psychosis that present as mania. However, would also be cautious to rule out medical contributors to altered mental status, which is more likely with a rapid onset of psychosis such as in this case. As such, it would be appropriate to order head imaging especially as patient has never previously exhibited a psychiatric decompensation. Given patient's significant lack of insight, she was unable to meaningfully engage in safety planning . Patient is admitted to [facility] unit with Q15 min safety monitoring. Multidisciplinary team approach is offered. Medication management; group/milieu therapy is offered.  Subjective:  Chart reviewed, case discussed in multidisciplinary meeting, patient seen during rounds.  Today per nursing report patient is demanding discharge.  She informed the team that she called her insurance and she was informed that she can walk out of the unit anytime.  Patient met with the treatment team including on-call social worker and her nurse [place].  Provider explained that patient is currently on involuntary commitment and the legal proceedings for that..  She and was also informed of the concerns about her paranoia and the reaction to her medications which needs further monitoring.  Patient has not been started on any psychotropic medications in the ED and today she was started on Zyprexa 5 mg twice daily to help with psychosis and mood stabilization.  Patient became very labile and hostile repeating herself that her insurance company told she can walk out anytime and she is going to report everybody on the unit for holding her against her  wheeled.  She lacks insight into her mental health problems and continue to demand discharge.  Team left the room after explaining the whole IVC process to her.  Per nursing initially patient refused all medication but later on took her Zyprexa.   Sleep: Poor  Appetite:  Fair  Past Psychiatric History: see h&P Family History:  Family History  Problem Relation Age of Onset   Kidney disease Mother        died of ESRD   Diabetes Mother    Arthritis Father    Dementia Father    Asthma Sister    Breast cancer Sister 71   Glaucoma Sister    Diabetes Sister    Diabetes Sister        s/p BKA   Throat cancer Paternal Grandmother    Pancreatic cancer Paternal Uncle    Colon cancer Neg Hx    Social History:  Social History   Substance and Sexual Activity  Alcohol Use Never     Social History   Substance and Sexual Activity  Drug Use No    Social History   Socioeconomic History   Marital status: Married    Spouse name: Not on file   Number of children: 2   Years of education: Not on file   Highest education level: Associate degree: occupational, Scientist, product/process development, or vocational program  Occupational History   Not on file  Tobacco Use   Smoking status: Former    Current packs/day: 0.00    Types: Cigarettes    Quit date: 01/03/1988    Years since quitting: 35.8   Smokeless tobacco: Never  Vaping Use  Vaping status: Never Used  Substance and Sexual Activity   Alcohol use: Never   Drug use: No   Sexual activity: Yes    Partners: Male    Birth control/protection: Post-menopausal  Other Topics Concern   Not on file  Social History Narrative   Not on file   Social Drivers of Health   Financial Resource Strain: Low Risk  (09/24/2023)   Received from [facility]   Overall Financial Resource Strain (CARDIA)    Difficulty of Paying Living Expenses: Not hard at all  Food Insecurity: No Food Insecurity (10/11/2023)   Hunger Vital Sign    Worried About Running  Out of Food in the Last Year: Never true    Ran Out of Food in the Last Year: Never true  Transportation Needs: No Transportation Needs (10/11/2023)   PRAPARE - Administrator, Civil Service (Medical): No    Lack of Transportation (Non-Medical): No  Physical Activity: Sufficiently Active (03/01/2023)   Exercise Vital Sign    Days of Exercise per Week: 6 days    Minutes of Exercise per Session: 30 min  Stress: No Stress Concern Present (03/01/2023)   [employer] of Occupational Health - Occupational Stress Questionnaire    Feeling of Stress : Not at all  Social Connections: Moderately Integrated (03/01/2023)   Social Connection and Isolation Panel    Frequency of Communication with Friends and Family: More than three times a week    Frequency of Social Gatherings with Friends and Family: Once a week    Attends Religious Services: More than 4 times per year    Active Member of [employer] or Organizations: No    Attends Engineer, structural: Not on file    Marital Status: Married   Past Medical History:  Past Medical History:  Diagnosis Date   Allergy    Anemia    Arthritis    rheumatiod   Hypertension    Pre-diabetes     Past Surgical History:  Procedure Laterality Date   BREAST BIOPSY     BREAST CYST ASPIRATION Left    x 2   COLONOSCOPY WITH PROPOFOL  N/A 09/22/2021   Procedure: COLONOSCOPY WITH PROPOFOL ;  Surgeon: Therisa Bi, MD;  Location: [facility] ENDOSCOPY;  Service: Gastroenterology;  Laterality: N/A;   EYE SURGERY Left 1980   stye removal   FOOT SURGERY Left 08/22/2023   HALLUX VALGUS AKIN Left 08/22/2023   Procedure: CORRECTION, HALLUX VALGUS, WITH AKIN OSTEOTOMY;  Surgeon: Ashley Soulier, DPM;  Location: [facility];  Service: Orthopedics/Podiatry;  Laterality: Left;   HALLUX VALGUS LAPIDUS Left 08/22/2023   Procedure: ROMAYNE LOOK;  Surgeon: Ashley Soulier, DPM;  Location: [facility];  Service: Orthopedics/Podiatry;   Laterality: Left;   TUBAL LIGATION  2007    Current Medications: Current Facility-Administered Medications  Medication Dose Route Frequency Provider Last Rate Last Admin   acetaminophen  (TYLENOL ) tablet 650 mg  650 mg Oral Q6H PRN Trudy Carwin, NP       alum & mag hydroxide-simeth (MAALOX/MYLANTA) 200-200-20 MG/5ML suspension 30 mL  30 mL Oral Q4H PRN Trudy Carwin, NP       folic acid  (FOLVITE ) tablet 1 mg  1 mg Oral Daily Aiyden Lauderback, MD   1 mg at 10/13/23 0925   hydrALAZINE (APRESOLINE) tablet 25 mg  25 mg Oral Q8H PRN Alaisa Moffitt, MD   25 mg at 10/12/23 0317   losartan  (COZAAR ) tablet 50 mg  50 mg Oral Daily Fredderick Swanger,  Georgiann Neider, MD   50 mg at 10/13/23 0925   magnesium hydroxide (MILK OF MAGNESIA) suspension 30 mL  30 mL Oral Daily PRN Trudy Carwin, NP       metoprolol  succinate (TOPROL -XL) 24 hr tablet 25 mg  25 mg Oral Daily Catricia Scheerer, MD   25 mg at 10/13/23 0925   OLANZapine (ZYPREXA) tablet 5 mg  5 mg Oral BID Hearl Heikes, MD   5 mg at 10/13/23 1238   OLANZapine zydis (ZYPREXA) disintegrating tablet 5 mg  5 mg Oral TID PRN Trudy Carwin, NP   5 mg at 10/11/23 1956   pyridOXINE (VITAMIN B6) tablet 25 mg  25 mg Oral Daily Mackensi Mahadeo, MD   25 mg at 10/13/23 9074   traZODone  (DESYREL ) tablet 50 mg  50 mg Oral QHS McLauchlin, Angela, NP   50 mg at 10/12/23 2110    Lab Results:  Results for orders placed or performed during the hospital encounter of 10/11/23 (from the past 48 hours)  Glucose, capillary     Status: Abnormal   Collection Time: 10/11/23  7:53 PM  Result Value Ref Range   Glucose-Capillary 120 (H) 70 - 99 mg/dL    Comment: Glucose reference range applies only to samples taken after fasting for at least 8 hours.    Blood Alcohol level:  Lab Results  Component Value Date   [facility] <15 10/10/2023    Metabolic Disorder Labs: Lab Results  Component Value Date   HGBA1C 5.9 (H) 08/02/2023   No results found for: PROLACTIN Lab Results  Component  Value Date   CHOL 179 08/02/2023   TRIG 68 08/02/2023   HDL 60 08/02/2023   CHOLHDL 3.0 08/02/2023   LDLCALC 106 (H) 08/02/2023   LDLCALC 119 (H) 08/21/2022    Physical Findings: AIMS:  , ,  ,  ,    CIWA:    COWS:      Psychiatric Specialty Exam:  Presentation  General Appearance: Bizarre  Eye Contact:Fair  Speech:Normal Rate  Speech Volume:Decreased    Mood and Affect  Mood:Labile; Irritable  Affect:Labile   Thought Process  Thought Processes:Disorganized  Descriptions of Associations:No data recorded Orientation:Full (Time, Place and Person)  Thought Content:Illogical; Paranoid Ideation  Hallucinations:Hallucinations: None  Ideas of Reference:Paranoia  Suicidal Thoughts:Suicidal Thoughts: No  Homicidal Thoughts:Homicidal Thoughts: No   Sensorium  Memory:Immediate Fair; Recent Fair  Judgment:Impaired  Insight:Shallow   Executive Functions  Concentration:Poor  Attention Span:Poor  Recall:Fair  Fund of Knowledge:Fair  Language:Fair   Psychomotor Activity  Psychomotor Activity:Psychomotor Activity: Normal  Musculoskeletal: Strength & Muscle Tone: within normal limits Gait & Station: normal Assets  Assets:Communication Skills; Social Support    Physical Exam: Physical Exam Vitals and nursing note reviewed.    ROS Blood pressure 105/75, pulse (!) 118, temperature (!) 97.2 F (36.2 C), resp. rate 18, height 5' 3 (1.6 m), weight 58.5 kg, last menstrual period 05/22/2015, SpO2 100%. Body mass index is 22.85 kg/m.  Diagnosis: Principal Problem:   Psychosis [facility])   Clinical Decision Making: Patient is currently admitted for worsening paranoia and aggressive behaviors in the community with a possible contribution from her medication methotrexate  that she has been taking for a month.  Patient needs close monitoring.  Treatment Plan Summary:  Safety and Monitoring:             -- Involuntary admission to inpatient psychiatric  unit for safety, stabilization and treatment             --  Daily contact with patient to assess and evaluate symptoms and progress in treatment             -- Patient's case to be discussed in multi-disciplinary team meeting             -- Observation Level: q15 minute checks             -- Vital signs:  q12 hours             -- Precautions: suicide, elopement, and assault   2. Psychiatric Diagnoses and Treatment:              Zyprexa 5 mg twice daily is initiated to help with mood and psychosis     -- The risks/benefits/side-effects/alternatives to this medication were discussed in detail with the patient and time was given for questions. The patient consents to medication trial.                -- Metabolic profile and EKG monitoring obtained while on an atypical antipsychotic (BMI: Lipid Panel: HbgA1c: QTc:)              -- Encouraged patient to participate in unit milieu and in scheduled group therapies                            3. Medical Issues Being Addressed:   4. Discharge Planning:   -- Social work and case management to assist with discharge planning and identification of hospital follow-up needs prior to discharge  -- Estimated LOS: 3-4 days  Allyn Foil, MD 10/13/2023, 5:46 PM

## 2023-10-13 NOTE — Group Note (Signed)
 Date:  10/13/2023 Time:  5:05 PM  Group Topic/Focus:  Thank fulness - List 2 things that you are thankful for.    Participation Level:  Did Not Attend  Participation Quality:    Affect:    Cognitive:    Insight:   Engagement in Group:    Modes of Intervention:    Additional Comments:    Garen CINDERELLA Daring 10/13/2023, 5:05 PM

## 2023-10-13 NOTE — Plan of Care (Signed)
  Problem: Education: Goal: Knowledge of the prescribed therapeutic regimen will improve Outcome: Progressing   Problem: Education: Goal: Will be free of psychotic symptoms Outcome: Not Progressing

## 2023-10-13 NOTE — Group Note (Signed)
 Date:  10/13/2023 Time:  9:01 PM  Group Topic/Focus:  Healthy Communication:   The focus of this group is to discuss communication, barriers to communication, as well as healthy ways to communicate with others.  MHT Francis conducted group. MHT made introduction and reviewed expectations of the unit. MHT discussed 15 minute checks and made patients aware staff would be looking in throughout the night. MHT dicussed options to reduce the possibility of disturbing sleep throughout the night.  MHT discussed medication needs. MHT explained if something was needed, such as a Tylenol  for a headache, patients would need to notify the nurse. MHT explained the nurse would need to send a message to the doctor and would have to wait for an order to be put in before they could actually give it. MHT explained the doctor's are working in the emergency room so it could take a hour or more before they are able to respond. MHT explained the nurse would not be able to give anything that is not already on the [facility].  MHT asked about each person's day. MHT opened the floor to express any concerns that needed to be addressed. One patient asked about being able to refuse medications. MHT addressed concern and and explained no one is forced to take medications. The patient stated she was forced to take medications. MHT clarified and explained the exception to forced medications. MHT informed patient that if under an involuntary commitment order, medications could be forced. Patient stated that was what happened to her. MHT encouraged patient to discuss matter with nurse in private.  MHT addressed the topic of communicating with prescribing physician should someone have a concern with medications. MHT encouraged open communication with the doctor and nurses. MHT explained there were many different medications that have the same expected outcome, so if one isn't working, the doctor can try a different one. MHT explained each person  is different, so communicating with the doctor can help them to find the right medication for the individual. MHT encouraged patients to talk with their doctor's instead of not taking their medications. MHT explained they may only need an adjustment or the doctor might try something different.  No other concerns were reported in group.  Participation Level:  Active  Participation Quality:  Appropriate and Sharing  Affect:  Angry  Cognitive:  Appropriate  Insight: Appropriate  Engagement in Group:  Engaged  Modes of Intervention:  Discussion  Additional Comments:  Rhemi was angry about being forced to take medications on this day. Lynzi expressed her anger without being disruptive. Alton reported she came to the hospital voluntarily, so being angry about being forced to take medications was was reasonable reaction, as most people would express similar frustrations. Kawana stated she was going to seek a lawyer, due to being placed on an IVC order. MHT explained that once on an IVC, only a doctor is able to release the person from the hospital.  Francis JONETTA Boos 10/13/2023, 9:01 PM

## 2023-10-14 MED ORDER — RISPERIDONE 1 MG PO TABS
1.0000 mg | ORAL_TABLET | Freq: Every day | ORAL | Status: DC
  Administered 2023-10-15 – 2023-10-17 (×3): 1 mg via ORAL
  Filled 2023-10-14 (×3): qty 1

## 2023-10-14 MED ORDER — NAPROXEN 250 MG PO TABS
250.0000 mg | ORAL_TABLET | Freq: Three times a day (TID) | ORAL | Status: DC
  Administered 2023-10-14 – 2023-10-18 (×11): 250 mg via ORAL
  Filled 2023-10-14 (×13): qty 1

## 2023-10-14 NOTE — Plan of Care (Signed)
   Problem: Education: Goal: Knowledge of [facility] Education information/materials will improve Outcome: Progressing Goal: Emotional status will improve Outcome: Progressing Goal: Mental status will improve Outcome: Progressing Goal: Verbalization of understanding the information provided will improve Outcome: Progressing   Problem: Activity: Goal: Interest or engagement in activities will improve Outcome: Progressing Goal: Sleeping patterns will improve Outcome: Progressing   Problem: Coping: Goal: Ability to verbalize frustrations and anger appropriately will improve Outcome: Progressing Goal: Ability to demonstrate self-control will improve Outcome: Progressing

## 2023-10-14 NOTE — Group Note (Signed)
 Date:  10/14/2023 Time:  10:59 AM  Group Topic/Focus:  Goals/Love Languages Group:   The focus of this group is to help patients establish daily goals to achieve during treatment and discuss how the patient can incorporate goal setting into their daily lives to aide in recovery. Also patients learned about the different loves languages and identified which love language traits were theirs,     Participation Level:  Did Not Attend  Beatris ONEIDA Hasten 10/14/2023, 10:59 AM

## 2023-10-14 NOTE — Group Note (Signed)
 LCSW Group Therapy Note  Group Date: 10/14/2023 Start Time: 1430 End Time: 1530   Type of Therapy and Topic:  Group Therapy - Healthy vs Unhealthy Coping Skills  Participation Level:  Did Not Attend   Description of Group The focus of this group was to determine what unhealthy coping techniques typically are used by group members and what healthy coping techniques would be helpful in coping with various problems. Patients were guided in becoming aware of the differences between healthy and unhealthy coping techniques. Patients were asked to identify 2-3 healthy coping skills they would like to learn to use more effectively.  Therapeutic Goals Patients learned that coping is what human beings do all day long to deal with various situations in their lives Patients defined and discussed healthy vs unhealthy coping techniques Patients identified their preferred coping techniques and identified whether these were healthy or unhealthy Patients determined 2-3 healthy coping skills they would like to become more familiar with and use more often. Patients provided support and ideas to each other   Summary of Patient Progress: The patient did not attend group.  Therapeutic Modalities Cognitive Behavioral Therapy   Shawntavia Saunders S Javeah Loeza, LCSWA 10/14/2023  3:37 PM

## 2023-10-14 NOTE — Group Note (Signed)
 Date:  10/14/2023 Time:  6:59 PM  Group Topic/Focus:  Rediscovering Joy:   The focus of this group is to explore various ways to relieve stress in a positive manner.    Participation Level:  Did Not Attend   Harlene LITTIE Gavel 10/14/2023, 6:59 PM

## 2023-10-14 NOTE — Progress Notes (Signed)
 [facility] MD Progress Note  10/14/2023 10:54 PM Sylvia Beasley  MRN:  [id number]  Sylvia Beasley is is a 58 y.o. female with no previous mental health history with symptoms concerning for mania. Given the rapid onset of symptoms would posit that initiation of methotrexate  may have contributed to this presentation as there are rare cases of methotrexate  induced psychosis that present as mania. However, would also be cautious to rule out medical contributors to altered mental status, which is more likely with a rapid onset of psychosis such as in this case. As such, it would be appropriate to order head imaging especially as patient has never previously exhibited a psychiatric decompensation. Given patient's significant lack of insight, she was unable to meaningfully engage in safety planning . Patient is admitted to [facility] unit with Q15 min safety monitoring. Multidisciplinary team approach is offered. Medication management; group/milieu therapy is offered.  Subjective:  Chart reviewed, case discussed in multidisciplinary meeting, patient seen during rounds.  Patient is noted to be resting in her room.  She had multiple requests about her diet including celery and beets.  Provider educated patient on consulting dietitian so that her diet order can be adjusted.  Patient reports that on Zyprexa she is feeling well he wobbly on her feet.  Provider discussed that Zyprexa can be discontinued and Risperdal 1 mg nightly can be added so that she takes only medication at night.  Patient agreed with the plan.  Patient reports that she is having a lot of hair loss since she was started on methotrexate  and requested some hair oils from her house.  Patient discussed with the treatment team and approved the use of hair oil as patient states inpatient.  Patient denies SI/HI/plan.  She denies auditory/visual hallucinations but displays ongoing paranoia about her situation and any discussions the provider is having around the  medication and her presentation to the hospital.   Sleep: Poor  Appetite:  Fair  Past Psychiatric History: see h&P Family History:  Family History  Problem Relation Age of Onset   Kidney disease Mother        died of ESRD   Diabetes Mother    Arthritis Father    Dementia Father    Asthma Sister    Breast cancer Sister 82   Glaucoma Sister    Diabetes Sister    Diabetes Sister        s/p BKA   Throat cancer Paternal Grandmother    Pancreatic cancer Paternal Uncle    Colon cancer Neg Hx    Social History:  Social History   Substance and Sexual Activity  Alcohol Use Never     Social History   Substance and Sexual Activity  Drug Use No    Social History   Socioeconomic History   Marital status: Married    Spouse name: Not on file   Number of children: 2   Years of education: Not on file   Highest education level: Associate degree: occupational, Scientist, product/process development, or vocational program  Occupational History   Not on file  Tobacco Use   Smoking status: Former    Current packs/day: 0.00    Types: Cigarettes    Quit date: 01/03/1988    Years since quitting: 35.8   Smokeless tobacco: Never  Vaping Use   Vaping status: Never Used  Substance and Sexual Activity   Alcohol use: Never   Drug use: No   Sexual activity: Yes    Partners: Male    Birth  control/protection: Post-menopausal  Other Topics Concern   Not on file  Social History Narrative   Not on file   Social Drivers of Health   Financial Resource Strain: Low Risk  (09/24/2023)   Received from [facility]   Overall Financial Resource Strain (CARDIA)    Difficulty of Paying Living Expenses: Not hard at all  Food Insecurity: No Food Insecurity (10/11/2023)   Hunger Vital Sign    Worried About Running Out of Food in the Last Year: Never true    Ran Out of Food in the Last Year: Never true  Transportation Needs: No Transportation Needs (10/11/2023)   PRAPARE - Scientist, research (physical sciences) (Medical): No    Lack of Transportation (Non-Medical): No  Physical Activity: Sufficiently Active (03/01/2023)   Exercise Vital Sign    Days of Exercise per Week: 6 days    Minutes of Exercise per Session: 30 min  Stress: No Stress Concern Present (03/01/2023)   [employer] of Occupational Health - Occupational Stress Questionnaire    Feeling of Stress : Not at all  Social Connections: Moderately Integrated (03/01/2023)   Social Connection and Isolation Panel    Frequency of Communication with Friends and Family: More than three times a week    Frequency of Social Gatherings with Friends and Family: Once a week    Attends Religious Services: More than 4 times per year    Active Member of [employer] or Organizations: No    Attends Engineer, structural: Not on file    Marital Status: Married   Past Medical History:  Past Medical History:  Diagnosis Date   Allergy    Anemia    Arthritis    rheumatiod   Hypertension    Pre-diabetes     Past Surgical History:  Procedure Laterality Date   BREAST BIOPSY     BREAST CYST ASPIRATION Left    x 2   COLONOSCOPY WITH PROPOFOL  N/A 09/22/2021   Procedure: COLONOSCOPY WITH PROPOFOL ;  Surgeon: Therisa Bi, MD;  Location: [facility] ENDOSCOPY;  Service: Gastroenterology;  Laterality: N/A;   EYE SURGERY Left 1980   stye removal   FOOT SURGERY Left 08/22/2023   HALLUX VALGUS AKIN Left 08/22/2023   Procedure: CORRECTION, HALLUX VALGUS, WITH AKIN OSTEOTOMY;  Surgeon: Ashley Soulier, DPM;  Location: [facility];  Service: Orthopedics/Podiatry;  Laterality: Left;   HALLUX VALGUS LAPIDUS Left 08/22/2023   Procedure: ROMAYNE LOOK;  Surgeon: Ashley Soulier, DPM;  Location: [facility];  Service: Orthopedics/Podiatry;  Laterality: Left;   TUBAL LIGATION  2007    Current Medications: Current Facility-Administered Medications  Medication Dose Route Frequency Provider Last Rate Last Admin   acetaminophen   (TYLENOL ) tablet 650 mg  650 mg Oral Q6H PRN Trudy Carwin, NP   650 mg at 10/13/23 2332   alum & mag hydroxide-simeth (MAALOX/MYLANTA) 200-200-20 MG/5ML suspension 30 mL  30 mL Oral Q4H PRN Trudy Carwin, NP       folic acid  (FOLVITE ) tablet 1 mg  1 mg Oral Daily Khamora Karan, MD   1 mg at 10/14/23 0932   hydrALAZINE (APRESOLINE) tablet 25 mg  25 mg Oral Q8H PRN Pamila Mendibles, MD   25 mg at 10/13/23 2126   losartan  (COZAAR ) tablet 50 mg  50 mg Oral Daily Yavuz Kirby, MD   50 mg at 10/14/23 0932   magnesium hydroxide (MILK OF MAGNESIA) suspension 30 mL  30 mL Oral Daily PRN Trudy Carwin, NP  metoprolol  succinate (TOPROL -XL) 24 hr tablet 25 mg  25 mg Oral Daily Rees Matura, MD   25 mg at 10/14/23 0932   naproxen (NAPROSYN) tablet 250 mg  250 mg Oral TID WC Shondell Fabel, MD   250 mg at 10/14/23 1739   OLANZapine zydis (ZYPREXA) disintegrating tablet 5 mg  5 mg Oral TID PRN Trudy Carwin, NP   5 mg at 10/11/23 1956   pyridOXINE (VITAMIN B6) tablet 25 mg  25 mg Oral Daily [name], MD   25 mg at 10/14/23 0932   [START ON 10/15/2023] risperiDONE (RISPERDAL) tablet 1 mg  1 mg Oral QHS Sharicka Pogorzelski, MD       traZODone  (DESYREL ) tablet 50 mg  50 mg Oral QHS McLauchlin, Angela, NP   50 mg at 10/14/23 2139    Lab Results:  No results found for this or any previous visit (from the past 48 hours).   Blood Alcohol level:  Lab Results  Component Value Date   [facility] <15 10/10/2023    Metabolic Disorder Labs: Lab Results  Component Value Date   HGBA1C 5.9 (H) 08/02/2023   No results found for: PROLACTIN Lab Results  Component Value Date   CHOL 179 08/02/2023   TRIG 68 08/02/2023   HDL 60 08/02/2023   CHOLHDL 3.0 08/02/2023   LDLCALC 106 (H) 08/02/2023   LDLCALC 119 (H) 08/21/2022    Physical Findings: AIMS:  , ,  ,  ,    CIWA:    COWS:      Psychiatric Specialty Exam:  Presentation  General Appearance: Bizarre  Eye Contact:Fair  Speech:Normal  Rate  Speech Volume:Decreased    Mood and Affect  Mood:Labile; Irritable  Affect:Labile   Thought Process  Thought Processes:Disorganized  Descriptions of Associations:No data recorded Orientation:Full (Time, Place and Person)  Thought Content:Illogical; Paranoid Ideation  Hallucinations:Hallucinations: None  Ideas of Reference:Paranoia  Suicidal Thoughts:Suicidal Thoughts: No  Homicidal Thoughts:Homicidal Thoughts: No   Sensorium  Memory:Immediate Fair; Recent Fair  Judgment:Impaired  Insight:Shallow   Executive Functions  Concentration:Poor  Attention Span:Poor  Recall:Fair  Fund of Knowledge:Fair  Language:Fair   Psychomotor Activity  Psychomotor Activity:Psychomotor Activity: Normal  Musculoskeletal: Strength & Muscle Tone: within normal limits Gait & Station: normal Assets  Assets:Communication Skills; Social Support    Physical Exam: Physical Exam Vitals and nursing note reviewed.    ROS Blood pressure (!) 128/91, pulse 96, temperature (!) 97.5 F (36.4 C), resp. rate 14, height 5' 3 (1.6 m), weight 58.5 kg, last menstrual period 05/22/2015, SpO2 100%. Body mass index is 22.85 kg/m.  Diagnosis: Principal Problem:   Psychosis [facility])   Clinical Decision Making: Patient is currently admitted for worsening paranoia and aggressive behaviors in the community with a possible contribution from her medication methotrexate  that she has been taking for a month.  Patient needs close monitoring.  Treatment Plan Summary:  Safety and Monitoring:             -- Involuntary admission to inpatient psychiatric unit for safety, stabilization and treatment             -- Daily contact with patient to assess and evaluate symptoms and progress in treatment             -- Patient's case to be discussed in multi-disciplinary team meeting             -- Observation Level: q15 minute checks             --  Vital signs:  q12 hours             --  Precautions: suicide, elopement, and assault   2. Psychiatric Diagnoses and Treatment:              Discontinue Zyprexa 5 mg twice daily as patient reports dizziness and unstable gait Risperdal 1 mg nightly is initiated Rheumatoid arthritis-Naprosyn will be tried to reduce inflammation and until her discharge.     -- The risks/benefits/side-effects/alternatives to this medication were discussed in detail with the patient and time was given for questions. The patient consents to medication trial.                -- Metabolic profile and EKG monitoring obtained while on an atypical antipsychotic (BMI: Lipid Panel: HbgA1c: QTc:)              -- Encouraged patient to participate in unit milieu and in scheduled group therapies                            3. Medical Issues Being Addressed:   4. Discharge Planning:   -- Social work and case management to assist with discharge planning and identification of hospital follow-up needs prior to discharge  -- Estimated LOS: 3-4 days  Allyn Foil, MD 10/14/2023, 10:54 PM

## 2023-10-14 NOTE — Progress Notes (Signed)
 Pt ambulated between her room and dayroom area several times. Was able to hold a focused reality based conversation.  Informed staff that medication Zyprexa given her made her weaker; that she came in here ambulating without a walker now she is having difficulty ambulating.  C.o pain r/t arthritis; Tylenol  given.  Pt was requesting Ibuprofen noting that she takes 800 mg at home for such pain.  She was informed that because her creatinine level was high MD will not order her Ibuprofen. Pt denied depression, AVH, delusion and paranoia thoughts but expressed being under a lot stress because she is going through divorce, and the it is her husband who was instrumental in having her committed.  Pt refused scheduled Zyprexa and Trazodone .    10/13/23 2200  Psych Admission Type (Psych Patients Only)  Admission Status Involuntary  Psychosocial Assessment  Patient Complaints Anxiety  Eye Contact Fair  Facial Expression Animated  Affect Labile  Speech Logical/coherent  Interaction Assertive  Motor Activity Other (Comment) (Ambulates with a walker)  Appearance/Hygiene Unremarkable  Behavior Characteristics Appropriate to situation  Mood Preoccupied  Thought Process  Coherency WDL  Content WDL  Delusions None reported or observed  Perception WDL  Hallucination None reported or observed  Judgment Limited  Confusion None  Danger to Self  Current suicidal ideation? Denies  Danger to Others  Danger to Others None reported or observed

## 2023-10-14 NOTE — Progress Notes (Signed)
   10/14/23 0800  Psych Admission Type (Psych Patients Only)  Admission Status Involuntary  Psychosocial Assessment  Patient Complaints Tension  Eye Contact Fair  Facial Expression Animated  Affect Labile  Speech Logical/coherent  Interaction Assertive  Motor Activity Other (Comment) (WNL)  Appearance/Hygiene In scrubs  Behavior Characteristics Appropriate to situation  Mood Preoccupied  Thought Process  Coherency Concrete thinking  Content Blaming others  Delusions None reported or observed  Perception WDL  Hallucination None reported or observed  Judgment Impaired  Confusion WDL  Danger to Self  Current suicidal ideation? Denies  Danger to Others  Danger to Others None reported or observed

## 2023-10-14 NOTE — Group Note (Signed)
 Date:  10/14/2023 Time:  10:58 PM  Group Topic/Focus:  Wrap-Up Group:   The focus of this group is to help patients review their daily goal of treatment and discuss progress on daily workbooks.    Participation Level:  Did Not Attend  Participation Quality:     Affect:     Cognitive:     Insight: None  Engagement in Group:  None  Modes of Intervention:     Additional Comments:    Sylvia Beasley Sylvia Beasley 10/14/2023, 10:58 PM

## 2023-10-14 NOTE — Plan of Care (Signed)
  Problem: Education: Goal: Will be free of psychotic symptoms Outcome: Not Progressing Goal: Knowledge of the prescribed therapeutic regimen will improve Outcome: Not Progressing   

## 2023-10-15 NOTE — Group Note (Signed)
 Date:  10/15/2023 Time:  11:25 AM  Group Topic/Focus:  Building Self Esteem:   The Focus of this group is helping patients become aware of the effects of self-esteem on their lives, the things they and others do that enhance or undermine their self-esteem, seeing the relationship between their level of self-esteem and the choices they make and learning ways to enhance self-esteem.    Participation Level:  Active  Participation Quality:  Appropriate  Affect:  Appropriate  Cognitive:  Appropriate  Insight: Appropriate  Engagement in Group:  Engaged  Modes of Intervention:  Discussion   Arland Nutting 10/15/2023, 11:25 AM

## 2023-10-15 NOTE — Progress Notes (Signed)
 [facility] MD Progress Note  10/15/2023 9:40 PM Sylvia Beasley  MRN:  [id number]  Sylvia Beasley is is a 58 y.o. female with no previous mental health history with symptoms concerning for mania. Given the rapid onset of symptoms would posit that initiation of methotrexate  may have contributed to this presentation as there are rare cases of methotrexate  induced psychosis that present as mania. However, would also be cautious to rule out medical contributors to altered mental status, which is more likely with a rapid onset of psychosis such as in this case. As such, it would be appropriate to order head imaging especially as patient has never previously exhibited a psychiatric decompensation. Given patient's significant lack of insight, she was unable to meaningfully engage in safety planning . Patient is admitted to [facility] unit with Q15 min safety monitoring. Multidisciplinary team approach is offered. Medication management; group/milieu therapy is offered.  Subjective:  Chart reviewed, case discussed in multidisciplinary meeting, patient seen during rounds.  Patient is noted to be resting in her bed.  She continues to display lack of insight into her mental health problems.  She remains discharge focused and wants to know when she will be discharged home and spite of extensive education on day-by-day basis about optimizing the medications.  Even though patient wanted to switch Zyprexa to another antipsychotic she remains concrete about that plan and demands discharge stating she took medications.  Provider educated patient that she will be receiving Risperdal for the first time tonight.  She talks about her job and the need for her to get out to be with her daughter.  Provider also discussed the concerns of her family.  Patient denies SI/HI/plan and denies auditory/visual hallucinations.    Sleep: Poor  Appetite:  Fair  Past Psychiatric History: see h&P Family History:  Family History  Problem Relation Age  of Onset   Kidney disease Mother        died of ESRD   Diabetes Mother    Arthritis Father    Dementia Father    Asthma Sister    Breast cancer Sister 5   Glaucoma Sister    Diabetes Sister    Diabetes Sister        s/p BKA   Throat cancer Paternal Grandmother    Pancreatic cancer Paternal Uncle    Colon cancer Neg Hx    Social History:  Social History   Substance and Sexual Activity  Alcohol Use Never     Social History   Substance and Sexual Activity  Drug Use No    Social History   Socioeconomic History   Marital status: Married    Spouse name: Not on file   Number of children: 2   Years of education: Not on file   Highest education level: Associate degree: occupational, Scientist, product/process development, or vocational program  Occupational History   Not on file  Tobacco Use   Smoking status: Former    Current packs/day: 0.00    Types: Cigarettes    Quit date: 01/03/1988    Years since quitting: 35.8   Smokeless tobacco: Never  Vaping Use   Vaping status: Never Used  Substance and Sexual Activity   Alcohol use: Never   Drug use: No   Sexual activity: Yes    Partners: Male    Birth control/protection: Post-menopausal  Other Topics Concern   Not on file  Social History Narrative   Not on file   Social Drivers of Health   Financial Resource Strain: Low  Risk  (09/24/2023)   Received from [facility]   Overall Financial Resource Strain (CARDIA)    Difficulty of Paying Living Expenses: Not hard at all  Food Insecurity: No Food Insecurity (10/11/2023)   Hunger Vital Sign    Worried About Running Out of Food in the Last Year: Never true    Ran Out of Food in the Last Year: Never true  Transportation Needs: No Transportation Needs (10/11/2023)   PRAPARE - Administrator, Civil Service (Medical): No    Lack of Transportation (Non-Medical): No  Physical Activity: Sufficiently Active (03/01/2023)   Exercise Vital Sign    Days of Exercise per Week: 6  days    Minutes of Exercise per Session: 30 min  Stress: No Stress Concern Present (03/01/2023)   [employer] of Occupational Health - Occupational Stress Questionnaire    Feeling of Stress : Not at all  Social Connections: Moderately Integrated (03/01/2023)   Social Connection and Isolation Panel    Frequency of Communication with Friends and Family: More than three times a week    Frequency of Social Gatherings with Friends and Family: Once a week    Attends Religious Services: More than 4 times per year    Active Member of [employer] or Organizations: No    Attends Engineer, structural: Not on file    Marital Status: Married   Past Medical History:  Past Medical History:  Diagnosis Date   Allergy    Anemia    Arthritis    rheumatiod   Hypertension    Pre-diabetes     Past Surgical History:  Procedure Laterality Date   BREAST BIOPSY     BREAST CYST ASPIRATION Left    x 2   COLONOSCOPY WITH PROPOFOL  N/A 09/22/2021   Procedure: COLONOSCOPY WITH PROPOFOL ;  Surgeon: Therisa Bi, MD;  Location: [facility] ENDOSCOPY;  Service: Gastroenterology;  Laterality: N/A;   EYE SURGERY Left 1980   stye removal   FOOT SURGERY Left 08/22/2023   HALLUX VALGUS AKIN Left 08/22/2023   Procedure: CORRECTION, HALLUX VALGUS, WITH AKIN OSTEOTOMY;  Surgeon: Ashley Soulier, DPM;  Location: [facility];  Service: Orthopedics/Podiatry;  Laterality: Left;   HALLUX VALGUS LAPIDUS Left 08/22/2023   Procedure: ROMAYNE LOOK;  Surgeon: Ashley Soulier, DPM;  Location: [facility];  Service: Orthopedics/Podiatry;  Laterality: Left;   TUBAL LIGATION  2007    Current Medications: Current Facility-Administered Medications  Medication Dose Route Frequency Provider Last Rate Last Admin   acetaminophen  (TYLENOL ) tablet 650 mg  650 mg Oral Q6H PRN Trudy Carwin, NP   650 mg at 10/13/23 2332   alum & mag hydroxide-simeth (MAALOX/MYLANTA) 200-200-20 MG/5ML suspension 30 mL  30 mL Oral Q4H  PRN Trudy Carwin, NP       folic acid  (FOLVITE ) tablet 1 mg  1 mg Oral Daily Karrington Mccravy, MD   1 mg at 10/15/23 0919   hydrALAZINE (APRESOLINE) tablet 25 mg  25 mg Oral Q8H PRN Mckay Brandt, MD   25 mg at 10/13/23 2126   losartan  (COZAAR ) tablet 50 mg  50 mg Oral Daily Trinidad Ingle, MD   50 mg at 10/15/23 1306   magnesium hydroxide (MILK OF MAGNESIA) suspension 30 mL  30 mL Oral Daily PRN Trudy Carwin, NP       metoprolol  succinate (TOPROL -XL) 24 hr tablet 25 mg  25 mg Oral Daily Tambria Pfannenstiel, MD   25 mg at 10/15/23 1305   naproxen (NAPROSYN) tablet  250 mg  250 mg Oral TID WC Mavis Fichera, MD   250 mg at 10/15/23 1729   OLANZapine zydis (ZYPREXA) disintegrating tablet 5 mg  5 mg Oral TID PRN Trudy Carwin, NP   5 mg at 10/11/23 1956   pyridOXINE (VITAMIN B6) tablet 25 mg  25 mg Oral Daily Mackenzy Eisenberg, MD   25 mg at 10/15/23 0919   risperiDONE (RISPERDAL) tablet 1 mg  1 mg Oral QHS Jonatha Gagen, MD   1 mg at 10/15/23 2118   traZODone  (DESYREL ) tablet 50 mg  50 mg Oral QHS McLauchlin, Angela, NP   50 mg at 10/15/23 2118    Lab Results:  No results found for this or any previous visit (from the past 48 hours).   Blood Alcohol level:  Lab Results  Component Value Date   [facility] <15 10/10/2023    Metabolic Disorder Labs: Lab Results  Component Value Date   HGBA1C 5.9 (H) 08/02/2023   No results found for: PROLACTIN Lab Results  Component Value Date   CHOL 179 08/02/2023   TRIG 68 08/02/2023   HDL 60 08/02/2023   CHOLHDL 3.0 08/02/2023   LDLCALC 106 (H) 08/02/2023   LDLCALC 119 (H) 08/21/2022    Physical Findings: AIMS:  , ,  ,  ,    CIWA:    COWS:      Psychiatric Specialty Exam:  Presentation  General Appearance: Bizarre  Eye Contact:Fair  Speech:Normal Rate  Speech Volume:Decreased    Mood and Affect  Mood:Labile; Irritable  Affect:Labile   Thought Process  Thought Processes:Disorganized  Descriptions of Associations:No data  recorded Orientation:Full (Time, Place and Person)  Thought Content:Illogical; Paranoid Ideation  Hallucinations:denies  Ideas of Reference:Paranoia  Suicidal Thoughts:denies  Homicidal Thoughts:denies   Sensorium  Memory:Immediate Fair; Recent Fair  Judgment:Impaired  Insight:Shallow   Executive Functions  Concentration:Poor  Attention Span:Poor  Recall:Fair  Fund of Knowledge:Fair  Language:Fair   Psychomotor Activity  Psychomotor Activity:No data recorded  Musculoskeletal: Strength & Muscle Tone: within normal limits Gait & Station: normal Assets  Assets:Communication Skills; Social Support    Physical Exam: Physical Exam Vitals and nursing note reviewed.    ROS Blood pressure 109/74, pulse 98, temperature 99.1 F (37.3 C), resp. rate 14, height 5' 3 (1.6 m), weight 58.5 kg, last menstrual period 05/22/2015, SpO2 100%. Body mass index is 22.85 kg/m.  Diagnosis: Principal Problem:   Psychosis [facility])   Clinical Decision Making: Patient is currently admitted for worsening paranoia and aggressive behaviors in the community with a possible contribution from her medication methotrexate  that she has been taking for a month.  Patient needs close monitoring.  Treatment Plan Summary:  Safety and Monitoring:             -- Involuntary admission to inpatient psychiatric unit for safety, stabilization and treatment             -- Daily contact with patient to assess and evaluate symptoms and progress in treatment             -- Patient's case to be discussed in multi-disciplinary team meeting             -- Observation Level: q15 minute checks             -- Vital signs:  q12 hours             -- Precautions: suicide, elopement, and assault   2. Psychiatric Diagnoses and Treatment:  Discontinue Zyprexa 5 mg twice daily as patient reports dizziness and unstable gait Risperdal 1 mg nightly is initiated Rheumatoid arthritis-Naprosyn will be  tried to reduce inflammation and until her discharge.     -- The risks/benefits/side-effects/alternatives to this medication were discussed in detail with the patient and time was given for questions. The patient consents to medication trial.                -- Metabolic profile and EKG monitoring obtained while on an atypical antipsychotic (BMI: Lipid Panel: HbgA1c: QTc:)              -- Encouraged patient to participate in unit milieu and in scheduled group therapies                            3. Medical Issues Being Addressed:   4. Discharge Planning:   -- Social work and case management to assist with discharge planning and identification of hospital follow-up needs prior to discharge  -- Estimated LOS: 3-4 days  Allyn Foil, MD 10/15/2023, 9:40 PM

## 2023-10-15 NOTE — Plan of Care (Signed)
  Problem: Education: Goal: Knowledge of [facility] Education information/materials will improve Outcome: Progressing Goal: Emotional status will improve Outcome: Progressing Goal: Mental status will improve Outcome: Progressing Goal: Verbalization of understanding the information provided will improve Outcome: Progressing   Problem: Activity: Goal: Interest or engagement in activities will improve Outcome: Progressing Goal: Sleeping patterns will improve Outcome: Progressing   Problem: Coping: Goal: Ability to verbalize frustrations and anger appropriately will improve Outcome: Progressing Goal: Ability to demonstrate self-control will improve Outcome: Progressing   Problem: Health Behavior/Discharge Planning: Goal: Identification of resources available to assist in meeting health care needs will improve Outcome: Progressing Goal: Compliance with treatment plan for underlying cause of condition will improve Outcome: Progressing   Problem: Safety: Goal: Periods of time without injury will increase Outcome: Progressing   Problem: Coping: Goal: Coping ability will improve Outcome: Progressing Goal: Will verbalize feelings Outcome: Progressing

## 2023-10-15 NOTE — Plan of Care (Signed)
   Problem: Education: Goal: Emotional status will improve Outcome: Progressing Goal: Mental status will improve Outcome: Progressing

## 2023-10-15 NOTE — Group Note (Signed)
 Recreation Therapy Group Note   Group Topic:Leisure Education  Group Date: 10/15/2023 Start Time: 1500 End Time: 1550 Facilitators: Celestia Jeoffrey BRAVO, LRT, CTRS Location: Courtyard  Group Description: Music. Patients are encouraged to name their favorite song(s) for LRT to play song through speaker for group to hear, while in the courtyard getting fresh air and sunlight. Patients educated on the definition of leisure and the importance of having different leisure interests outside of the hospital. Group discussed how leisure activities can often be used as Pharmacologist and that listening to music and being outside are examples.    Goal Area(s) Addressed:  Patient will identify a current leisure interest.  Patient will practice making a positive decision. Patient will have the opportunity to try a new leisure activity.   Affect/Mood: Appropriate   Participation Level: Active and Engaged   Participation Quality: Independent   Behavior: Calm and Cooperative   Speech/Thought Process: Coherent   Insight: Fair   Judgement: Fair    Modes of Intervention: Music and Open Conversation   Patient Response to Interventions:  Attentive, Engaged, and Receptive   Education Outcome:  In group clarification offered    Clinical Observations/Individualized Feedback: Ebelyn was active in their participation of session activities and group discussion. Pt interacted well with LRT and peers duration of session.    Plan: Continue to engage patient in RT group sessions 2-3x/week.   Jeoffrey BRAVO Celestia, LRT, CTRS 10/15/2023 4:17 PM

## 2023-10-15 NOTE — Progress Notes (Signed)
(  Sleep Hours) -8.00 (Any PRNs that were needed, meds refused, or side effects to meds)- N/A (Any disturbances and when (visitation, over night)-N/A (Concerns raised by the patient)- N/A (SI/HI/AVH)-Denies

## 2023-10-15 NOTE — Group Note (Signed)
 Physical/Occupational Therapy Group Note  Group Topic: Yoga  Group Date: 10/15/2023 Start Time: 1300 End Time: 1330 Facilitators: Damani Kelemen, Alm Hamilton, PT   Group Description: Group participated with series of yoga poses, designed to emphasize functional sitting balance, core stability, generalized flexibility and overall posture.  Incorporated deep breathing techniques with poses, working to promote relaxation, mindfulness and focus with targeted activities.   Discussed benefits of yoga in improving mood and self-esteem, reducing stress and anxiety, and promoting functional strength and balance for each participant.  Discussed ways to integrate into each participant's daily routine.  Provided handout with written and pictorial descriptions of included yoga movements to be utilized as appropriate outside of group time.  Therapeutic Goal(s):  Demonstrate safe ability to participate with yoga poses during group activity. Identify one benefit of participation with yoga poses as part of each participant's exercise/movement routine. Identify 1-2 individual poses that participant feels most beneficial to his/her needs and that he/she can easily replicate outside of group.  Individual Participation: Did not attend  Participation Level:   Participation Quality:   Behavior:   Speech/Thought Process:   Affect/Mood:   Insight:   Judgement:   Modes of Intervention:   Patient Response to Interventions:    Plan: Continue to engage patient in PT/OT groups 1 - 2x/week.  CHARM Hamilton Bertin PT, DPT 10/15/23, 2:16 PM

## 2023-10-15 NOTE — Group Note (Signed)
 Date:  10/15/2023 Time:  9:11 PM  Group Topic/Focus:  Wrap-Up Group:   The focus of this group is to help patients review their daily goal of treatment and discuss progress on daily workbooks.    Participation Level:  Active  Participation Quality:  Appropriate  Affect:  Excited  Cognitive:  Alert  Insight: Good  Engagement in Group:  Engaged  Modes of Intervention:  Discussion  Additional Comments:    Sylvia Beasley 10/15/2023, 9:11 PM

## 2023-10-15 NOTE — [facility] Suicide Risk Assessment (Signed)
 [facility] INPATIENT:  Family/Significant Other Suicide Prevention Education  Suicide Prevention Education:  Education Completed; Starleen Blush, [phone],  (name of family member/significant other) has been identified by the patient as the family member/significant other with whom the patient will be residing, and identified as the person(s) who will aid the patient in the event of a mental health crisis (suicidal ideations/suicide attempt).  With written consent from the patient, the family member/significant other has been provided the following suicide prevention education, prior to the and/or following the discharge of the patient.  The suicide prevention education provided includes the following: Suicide risk factors Suicide prevention and interventions National Suicide Hotline telephone number [facility] assessment telephone number [facility] Emergency Assistance 911 [facility] and/or Residential Mobile Crisis Unit telephone number  Request made of family/significant other to: Remove weapons (e.g., guns, rifles, knives), all items previously/currently identified as safety concern.   Remove drugs/medications (over-the-counter, prescriptions, illicit drugs), all items previously/currently identified as a safety concern.  The family member/significant other verbalizes understanding of the suicide prevention education information provided.  The family member/significant other agrees to remove the items of safety concern listed above.  Reports pt had extreme paranoia and erratic behavior. Reports he feels it might be from medication that she started to help her   Reports he does NOT feel pt is a danger to herself or others. Reports pt has NO access to weapons outside of kitchen knives, which he agrees to put up away from pt prior to pt's discharge.  Reports pt constantly talks about moving to [place] to be closer to their daughter.   Sylvia Beasley 10/15/2023, 10:46 AM

## 2023-10-15 NOTE — Progress Notes (Signed)
   10/15/23 1700  Psych Admission Type (Psych Patients Only)  Admission Status Involuntary  Psychosocial Assessment  Patient Complaints None  Eye Contact Fair  Facial Expression Animated  Affect Appropriate to circumstance  Speech Logical/coherent  Interaction Assertive  Motor Activity Other (Comment) (WNL)  Appearance/Hygiene In scrubs  Behavior Characteristics Cooperative  Mood Preoccupied  Thought Process  Coherency Concrete thinking  Content Blaming others  Delusions None reported or observed  Perception WDL  Hallucination None reported or observed  Judgment Impaired  Confusion WDL  Danger to Self  Current suicidal ideation? Denies  Danger to Others  Danger to Others None reported or observed

## 2023-10-15 NOTE — Group Note (Signed)
 Recreation Therapy Group Note   Group Topic:Coping Skills  Group Date: 10/15/2023 Start Time: 1100 End Time: 1130 Facilitators: Celestia Jeoffrey BRAVO, LRT, CTRS Location: Dayroom  Group Description: Seated Exercise. LRT discussed the mental and physical benefits of exercise. LRT and group discussed how physical activity can be used as a coping skill. Pt's and LRT followed along to an exercise video on the TV screen that provided a visual representation and audio description of every exercise performed. Pt's encouraged to listen to their bodies and stop at any time if they experience feelings of discomfort or pain. Pts were encouraged to drink water and stay hydrated.   Goal Area(s) Addressed:  Patient will learn benefits of physical activity. Patient will identify exercise as a coping skill.  Patient will follow multistep directions. Patient will try a new leisure interest.    Affect/Mood: Appropriate   Participation Level: Active and Engaged   Participation Quality: Independent   Behavior: Calm and Cooperative   Speech/Thought Process: Coherent   Insight: Good   Judgement: Good   Modes of Intervention: Activity, Education, and Exploration   Patient Response to Interventions:  Attentive, Engaged, and Receptive   Education Outcome:  Acknowledges education   Clinical Observations/Individualized Feedback: Sylvia Beasley was active in their participation of session activities and group discussion. Pt completed all exercises as prompted.    Plan: Continue to engage patient in RT group sessions 2-3x/week.   Jeoffrey BRAVO Celestia, LRT, CTRS 10/15/2023 1:54 PM

## 2023-10-15 NOTE — Progress Notes (Signed)
   10/14/23 2140  Psych Admission Type (Psych Patients Only)  Admission Status Involuntary  Psychosocial Assessment  Patient Complaints Other (Comment) (Arthritis;recent chemo tx)  Eye Contact Fair  Facial Expression Animated  Affect Labile  Speech Logical/coherent  Interaction Assertive  Motor Activity Slow  Appearance/Hygiene In scrubs  Behavior Characteristics Appropriate to situation  Mood Preoccupied  Aggressive Behavior  Effect No apparent injury  Thought Process  Coherency Concrete thinking  Content Blaming others  Delusions None reported or observed  Perception WDL  Hallucination None reported or observed  Judgment Impaired  Confusion WDL  Danger to Self  Current suicidal ideation? Denies  Danger to Others  Danger to Others None reported or observed

## 2023-10-16 MED ORDER — ENSURE MAX PROTEIN PO LIQD
11.0000 [oz_av] | Freq: Two times a day (BID) | ORAL | Status: DC
  Administered 2023-10-16: 11 [oz_av] via ORAL

## 2023-10-16 MED ORDER — ADULT MULTIVITAMIN W/MINERALS CH
1.0000 | ORAL_TABLET | Freq: Every day | ORAL | Status: DC
  Administered 2023-10-17 – 2023-10-18 (×2): 1 via ORAL
  Filled 2023-10-16 (×2): qty 1

## 2023-10-16 NOTE — Group Note (Signed)
 Date:  10/16/2023 Time:  11:11 PM  Group Topic/Focus:  Wrap-Up Group:   The focus of this group is to help patients review their daily goal of treatment and discuss progress on daily workbooks.    Participation Level:  Did Not Attend  Participation Quality:     Affect:     Cognitive:     Insight: None  Engagement in Group:  None  Modes of Intervention:     Additional Comments:    Tommas CHRISTELLA Bunker 10/16/2023, 11:11 PM

## 2023-10-16 NOTE — Plan of Care (Signed)
  Problem: Education: Goal: Knowledge of [facility] Education information/materials will improve Outcome: Progressing Goal: Emotional status will improve Outcome: Progressing Goal: Mental status will improve Outcome: Progressing Goal: Verbalization of understanding the information provided will improve Outcome: Progressing   Problem: Activity: Goal: Interest or engagement in activities will improve Outcome: Progressing Goal: Sleeping patterns will improve Outcome: Progressing   Problem: Coping: Goal: Ability to verbalize frustrations and anger appropriately will improve Outcome: Progressing Goal: Ability to demonstrate self-control will improve Outcome: Progressing   Problem: Safety: Goal: Periods of time without injury will increase Outcome: Progressing

## 2023-10-16 NOTE — Progress Notes (Signed)
 Patient is an involuntary admission to [facility] for [facility] with plans for discharge on Thursday. Patient denies SI, HI, AVH, anxiety and depression.  She attends meals and participates in 1 out of 3 groups. Patient spent a lot of time on the phone and did not seem to interact with other residents.  Will continue to monitor.

## 2023-10-16 NOTE — Progress Notes (Signed)
 [facility] MD Progress Note  10/16/2023 10:52 PM Sylvia Beasley  MRN:  [id number]  Sylvia Beasley is is a 58 y.o. female with no previous mental health history with symptoms concerning for mania. Given the rapid onset of symptoms would posit that initiation of methotrexate  may have contributed to this presentation as there are rare cases of methotrexate  induced psychosis that present as mania. However, would also be cautious to rule out medical contributors to altered mental status, which is more likely with a rapid onset of psychosis such as in this case. As such, it would be appropriate to order head imaging especially as patient has never previously exhibited a psychiatric decompensation. Given patient's significant lack of insight, she was unable to meaningfully engage in safety planning . Patient is admitted to [facility] unit with Q15 min safety monitoring. Multidisciplinary team approach is offered. Medication management; group/milieu therapy is offered.  Subjective:  Chart reviewed, case discussed in multidisciplinary meeting, patient seen during rounds.   10/14: 10/13:Patient is noted to be resting in her bed.  She continues to display lack of insight into her mental health problems.  She remains discharge focused and wants to know when she will be discharged home and spite of extensive education on day-by-day basis about optimizing the medications.  Even though patient wanted to switch Zyprexa to another antipsychotic she remains concrete about that plan and demands discharge stating she took medications.  Provider educated patient that she will be receiving Risperdal for the first time tonight.  She talks about her job and the need for her to get out to be with her daughter.  Provider also discussed the concerns of her family.  Patient denies SI/HI/plan and denies auditory/visual hallucinations.    Sleep: Poor  Appetite:  Fair  Past Psychiatric History: see h&P Family History:  Family History   Problem Relation Age of Onset   Kidney disease Mother        died of ESRD   Diabetes Mother    Arthritis Father    Dementia Father    Asthma Sister    Breast cancer Sister 63   Glaucoma Sister    Diabetes Sister    Diabetes Sister        s/p BKA   Throat cancer Paternal Grandmother    Pancreatic cancer Paternal Uncle    Colon cancer Neg Hx    Social History:  Social History   Substance and Sexual Activity  Alcohol Use Never     Social History   Substance and Sexual Activity  Drug Use No    Social History   Socioeconomic History   Marital status: Married    Spouse name: Not on file   Number of children: 2   Years of education: Not on file   Highest education level: Associate degree: occupational, Scientist, product/process development, or vocational program  Occupational History   Not on file  Tobacco Use   Smoking status: Former    Current packs/day: 0.00    Types: Cigarettes    Quit date: 01/03/1988    Years since quitting: 35.8   Smokeless tobacco: Never  Vaping Use   Vaping status: Never Used  Substance and Sexual Activity   Alcohol use: Never   Drug use: No   Sexual activity: Yes    Partners: Male    Birth control/protection: Post-menopausal  Other Topics Concern   Not on file  Social History Narrative   Not on file   Social Drivers of Corporate investment banker  Strain: Low Risk  (09/24/2023)   Received from [facility]   Overall Financial Resource Strain (CARDIA)    Difficulty of Paying Living Expenses: Not hard at all  Food Insecurity: No Food Insecurity (10/11/2023)   Hunger Vital Sign    Worried About Running Out of Food in the Last Year: Never true    Ran Out of Food in the Last Year: Never true  Transportation Needs: No Transportation Needs (10/11/2023)   PRAPARE - Administrator, Civil Service (Medical): No    Lack of Transportation (Non-Medical): No  Physical Activity: Sufficiently Active (03/01/2023)   Exercise Vital Sign    Days of  Exercise per Week: 6 days    Minutes of Exercise per Session: 30 min  Stress: No Stress Concern Present (03/01/2023)   [employer] of Occupational Health - Occupational Stress Questionnaire    Feeling of Stress : Not at all  Social Connections: Moderately Integrated (03/01/2023)   Social Connection and Isolation Panel    Frequency of Communication with Friends and Family: More than three times a week    Frequency of Social Gatherings with Friends and Family: Once a week    Attends Religious Services: More than 4 times per year    Active Member of [employer] or Organizations: No    Attends Engineer, structural: Not on file    Marital Status: Married   Past Medical History:  Past Medical History:  Diagnosis Date   Allergy    Anemia    Arthritis    rheumatiod   Hypertension    Pre-diabetes     Past Surgical History:  Procedure Laterality Date   BREAST BIOPSY     BREAST CYST ASPIRATION Left    x 2   COLONOSCOPY WITH PROPOFOL  N/A 09/22/2021   Procedure: COLONOSCOPY WITH PROPOFOL ;  Surgeon: Therisa Bi, MD;  Location: [facility] ENDOSCOPY;  Service: Gastroenterology;  Laterality: N/A;   EYE SURGERY Left 1980   stye removal   FOOT SURGERY Left 08/22/2023   HALLUX VALGUS AKIN Left 08/22/2023   Procedure: CORRECTION, HALLUX VALGUS, WITH AKIN OSTEOTOMY;  Surgeon: Ashley Soulier, DPM;  Location: [facility];  Service: Orthopedics/Podiatry;  Laterality: Left;   HALLUX VALGUS LAPIDUS Left 08/22/2023   Procedure: ROMAYNE LOOK;  Surgeon: Ashley Soulier, DPM;  Location: [facility];  Service: Orthopedics/Podiatry;  Laterality: Left;   TUBAL LIGATION  2007    Current Medications: Current Facility-Administered Medications  Medication Dose Route Frequency Provider Last Rate Last Admin   acetaminophen  (TYLENOL ) tablet 650 mg  650 mg Oral Q6H PRN Trudy Carwin, NP   650 mg at 10/16/23 2249   alum & mag hydroxide-simeth (MAALOX/MYLANTA) 200-200-20 MG/5ML suspension  30 mL  30 mL Oral Q4H PRN Trudy Carwin, NP       folic acid  (FOLVITE ) tablet 1 mg  1 mg Oral Daily Cyleigh Massaro, MD   1 mg at 10/16/23 0930   hydrALAZINE (APRESOLINE) tablet 25 mg  25 mg Oral Q8H PRN Aashrith Eves, MD   25 mg at 10/13/23 2126   losartan  (COZAAR ) tablet 50 mg  50 mg Oral Daily Kion Huntsberry, MD   50 mg at 10/16/23 0930   magnesium hydroxide (MILK OF MAGNESIA) suspension 30 mL  30 mL Oral Daily PRN Trudy Carwin, NP       metoprolol  succinate (TOPROL -XL) 24 hr tablet 25 mg  25 mg Oral Daily Amena Dockham, MD   25 mg at 10/16/23 0931   [START  ON 10/17/2023] multivitamin with minerals tablet 1 tablet  1 tablet Oral Daily Yasmene Salomone, MD       naproxen (NAPROSYN) tablet 250 mg  250 mg Oral TID WC Irene Mitcham, MD   250 mg at 10/16/23 1657   OLANZapine zydis (ZYPREXA) disintegrating tablet 5 mg  5 mg Oral TID PRN Trudy Carwin, NP   5 mg at 10/11/23 1956   protein supplement (ENSURE MAX) liquid  11 oz Oral BID Annagrace Carr, MD   11 oz at 10/16/23 1311   pyridOXINE (VITAMIN B6) tablet 25 mg  25 mg Oral Daily Iretta Mangrum, MD   25 mg at 10/16/23 0931   risperiDONE (RISPERDAL) tablet 1 mg  1 mg Oral QHS Darlina Mccaughey, MD   1 mg at 10/16/23 2150   traZODone  (DESYREL ) tablet 50 mg  50 mg Oral QHS McLauchlin, Angela, NP   50 mg at 10/16/23 2150    Lab Results:  No results found for this or any previous visit (from the past 48 hours).   Blood Alcohol level:  Lab Results  Component Value Date   [facility] <15 10/10/2023    Metabolic Disorder Labs: Lab Results  Component Value Date   HGBA1C 5.9 (H) 08/02/2023   No results found for: PROLACTIN Lab Results  Component Value Date   CHOL 179 08/02/2023   TRIG 68 08/02/2023   HDL 60 08/02/2023   CHOLHDL 3.0 08/02/2023   LDLCALC 106 (H) 08/02/2023   LDLCALC 119 (H) 08/21/2022    Physical Findings: AIMS:  , ,  ,  ,    CIWA:    COWS:      Psychiatric Specialty Exam:  Presentation  General Appearance:  Bizarre  Eye Contact:Fair  Speech:Normal Rate  Speech Volume:Decreased    Mood and Affect  Mood:Labile; Irritable  Affect:Labile   Thought Process  Thought Processes:Disorganized  Descriptions of Associations:No data recorded Orientation:Full (Time, Place and Person)  Thought Content:Illogical; Paranoid Ideation  Hallucinations:denies  Ideas of Reference:Paranoia  Suicidal Thoughts:denies  Homicidal Thoughts:denies   Sensorium  Memory:Immediate Fair; Recent Fair  Judgment:Impaired  Insight:Shallow   Executive Functions  Concentration:Poor  Attention Span:Poor  Recall:Fair  Fund of Knowledge:Fair  Language:Fair   Psychomotor Activity  Psychomotor Activity:No data recorded  Musculoskeletal: Strength & Muscle Tone: within normal limits Gait & Station: normal Assets  Assets:Communication Skills; Social Support    Physical Exam: Physical Exam Vitals and nursing note reviewed.    ROS Blood pressure (!) 148/83, pulse 97, temperature 98 F (36.7 C), resp. rate 14, height 5' 3 (1.6 m), weight 58.5 kg, last menstrual period 05/22/2015, SpO2 100%. Body mass index is 22.85 kg/m.  Diagnosis: Principal Problem:   Psychosis [facility])   Clinical Decision Making: Patient is currently admitted for worsening paranoia and aggressive behaviors in the community with a possible contribution from her medication methotrexate  that she has been taking for a month.  Patient needs close monitoring.  Treatment Plan Summary:  Safety and Monitoring:             -- Involuntary admission to inpatient psychiatric unit for safety, stabilization and treatment             -- Daily contact with patient to assess and evaluate symptoms and progress in treatment             -- Patient's case to be discussed in multi-disciplinary team meeting             -- Observation Level: q15 minute checks             --  Vital signs:  q12 hours             -- Precautions: suicide,  elopement, and assault   2. Psychiatric Diagnoses and Treatment:              Discontinue Zyprexa 5 mg twice daily as patient reports dizziness and unstable gait Risperdal 1 mg nightly is initiated Rheumatoid arthritis-Naprosyn will be tried to reduce inflammation and until her discharge.     -- The risks/benefits/side-effects/alternatives to this medication were discussed in detail with the patient and time was given for questions. The patient consents to medication trial.                -- Metabolic profile and EKG monitoring obtained while on an atypical antipsychotic (BMI: Lipid Panel: HbgA1c: QTc:)              -- Encouraged patient to participate in unit milieu and in scheduled group therapies                            3. Medical Issues Being Addressed:   4. Discharge Planning:   -- Social work and case management to assist with discharge planning and identification of hospital follow-up needs prior to discharge  -- Estimated LOS: 3-4 days  Allyn Foil, MD 10/16/2023, 10:52 PM

## 2023-10-16 NOTE — Progress Notes (Signed)
(  Sleep Hours) -7.75 (Any PRNs that were needed, meds refused, or side effects to meds)- N/A (Any disturbances and when (visitation, over night)-N/A (Concerns raised by the patient)- N/A (SI/HI/AVH)-Denies

## 2023-10-16 NOTE — Group Note (Signed)
 Recreation Therapy Group Note   Group Topic:Relaxation  Group Date: 10/16/2023 Start Time: 1100 End Time: 1130 Facilitators: Celestia Jeoffrey BRAVO, LRT, CTRS Location: Dayroom  Group Description: Chair Yoga. LRT and patients discussed the benefits of yoga and how it differs from strength exercises. LRT educated patients on the mental and physical benefits of yoga and deep breathing and how it can be used as a Associate Professor. LRT instructed patients on different stretching and yoga poses to complete that focused on all parts of the body, as well as deep breathing. Pt encouraged to stop movement at any time if they feel discomfort or pain.   Goal Area(s) Addressed: Patient will practice using relaxation technique. Patient will identify a new coping skill.  Patient will follow multistep directions to reduce anxiety and stress.   Affect/Mood: N/A   Participation Level: Did not attend    Clinical Observations/Individualized Feedback: Patient did not attend group.   Plan: Continue to engage patient in RT group sessions 2-3x/week.   Jeoffrey BRAVO Celestia, LRT, CTRS 10/16/2023 12:28 PM

## 2023-10-16 NOTE — Progress Notes (Signed)
   10/15/23 2118  Psych Admission Type (Psych Patients Only)  Admission Status Involuntary  Psychosocial Assessment  Patient Complaints None  Eye Contact Fair  Facial Expression Animated  Affect Labile  Speech Logical/coherent  Interaction Assertive  Motor Activity Slow  Appearance/Hygiene In scrubs  Behavior Characteristics Cooperative  Mood Preoccupied  Aggressive Behavior  Effect No apparent injury  Thought Process  Coherency Concrete thinking  Content Blaming others  Delusions None reported or observed  Perception WDL  Hallucination None reported or observed  Judgment Impaired  Confusion WDL  Danger to Self  Current suicidal ideation? Denies  Danger to Others  Danger to Others None reported or observed

## 2023-10-16 NOTE — Group Note (Signed)
 Date:  10/16/2023 Time:  11:12 AM  Group Topic/Focus:  Managing Feelings:   The focus of this group is to identify what feelings patients have difficulty handling and develop a plan to handle them in a healthier way upon discharge.    Participation Level:  Active  Participation Quality:  Appropriate  Affect:  Appropriate  Cognitive:  Appropriate  Insight: Appropriate  Engagement in Group:  Engaged  Modes of Intervention:  Discussion   Arland Nutting 10/16/2023, 11:12 AM

## 2023-10-16 NOTE — Group Note (Signed)
 [facility] LCSW Group Therapy Note    Group Date: 10/16/2023 Start Time: 1315 End Time: 1345  Type of Therapy and Topic:  Group Therapy:  Overcoming Obstacles  Participation Level:  [facility] PARTICIPATION LEVEL: Active  Mood:  Description of Group:   In this group patients will be encouraged to explore what they see as obstacles to their own wellness and recovery. They will be guided to discuss their thoughts, feelings, and behaviors related to these obstacles. The group will process together ways to cope with barriers, with attention given to specific choices patients can make. Each patient will be challenged to identify changes they are motivated to make in order to overcome their obstacles. This group will be process-oriented, with patients participating in exploration of their own experiences as well as giving and receiving support and challenge from other group members.  Therapeutic Goals: 1. Patient will identify personal and current obstacles as they relate to admission. 2. Patient will identify barriers that currently interfere with their wellness or overcoming obstacles.  3. Patient will identify feelings, thought process and behaviors related to these barriers. 4. Patient will identify two changes they are willing to make to overcome these obstacles:    Summary of Patient Progress   Pt was active and appropriate throughout group. Pt open to feedback from peers and group leader.    Therapeutic Modalities:   Cognitive Behavioral Therapy Solution Focused Therapy Motivational Interviewing Relapse Prevention Therapy   Lum JONETTA Croft, LCSWA

## 2023-10-16 NOTE — Group Note (Signed)
 Recreation Therapy Group Note   Group Topic:Health and Wellness  Group Date: 10/16/2023 Start Time: 1345 End Time: 1430 Facilitators: Celestia Jeoffrey BRAVO, LRT, CTRS Location: Courtyard  Group Description: Outdoor Recreation. Patients had the option to play corn hole, ring toss, UNO, or listening to music while outside in the courtyard getting fresh air and sunlight. Patients helped water and prune the raised garden beds. LRT and patients discussed things that they enjoy doing in their free time outside of the hospital. LRT encouraged patients to drink water after being active and getting their heart rate up.   Goal Area(s) Addressed: Patient will identify leisure interests.  Patient will practice healthy decision making. Patient will engage in recreation activity.   Affect/Mood: Appropriate   Participation Level: Minimal    Clinical Observations/Individualized Feedback: Sylvia Beasley was present for less than half of group. Pt chose to sit away from peers and LRT while outside.    Plan: Continue to engage patient in RT group sessions 2-3x/week.   Jeoffrey BRAVO Celestia, LRT, CTRS 10/16/2023 2:41 PM

## 2023-10-16 NOTE — [facility] Counselor (Signed)
 CSW met with the patient at patient's request.   Patient had questions on disability.   CSW answered questions.    CSW provided resources on completing disability paperwork in patient's chart for discharge.  Sherryle Margo, MSW, LCSW 10/16/2023 12:19 PM

## 2023-10-16 NOTE — Progress Notes (Signed)
 Initial Nutrition Assessment  DOCUMENTATION CODES:   Not applicable  INTERVENTION:   Ensure Max protein supplement po BID, each supplement provides 150kcal and 30g of protein.  MVI po daily   Folic acid  1mg  po daily   NUTRITION DIAGNOSIS:   Unintentional weight loss related to social / environmental circumstances as evidenced by 12 percent weight loss in     <3 months.  GOAL:   Patient will meet greater than or equal to 90% of their needs  MONITOR:   PO intake, Supplement acceptance  REASON FOR ASSESSMENT:   Consult Assessment of nutrition requirement/status  ASSESSMENT:   58 y.o. female with h/o GERD, RA on methotrexate  and HTN who is admitted with concerns for mania.  Met with pt in room today. Pt is very pleasant. Pt reports an unintentional weight loss of ~20lbs since June. Per chart, pt is down ~17lbs(12%) since July; this is significant weight loss. Pt reports that she was newly diagnosed with RA that has been debilitating for her. Pt reports that she has been unable to exercise as much; pt reports that she usually goes to the gym and does piliates. Pt reports poor appetite and oral intake for several months. Pt has not been drinking any supplements at home but does report that she takes folic acid  daily. Pt reports that her oral intake has improved in the hospital. Pt reports that she likes the hospital food but has been having issues being able to get some of the foods that she likes such as bacon. RD discussed with pt the importance of adequate nutrition needed to preserve lean muscle. Pt is agreeable to drink strawberry Ensure Max as she does not want any supplements with sugar. RD will add a note in health touch to allow patient to have bacon and a 3000mg  sodium allowance.   Medications reviewed and include: folic acid , B6  Labs reviewed:   Diet Order:   Diet Order             Diet 2 gram sodium Room service appropriate? Yes; Fluid consistency: Thin  Diet  effective now                  EDUCATION NEEDS:   Education needs have been addressed  Skin:  Skin Assessment: Reviewed RN Assessment  Last BM:  unknown  Height:   Ht Readings from Last 1 Encounters:  10/11/23 5' 3 (1.6 m)    Weight:   Wt Readings from Last 1 Encounters:  10/11/23 58.5 kg   BMI:  Body mass index is 22.85 kg/m.  Estimated Nutritional Needs:   Kcal:  1400-1600kcal/day  Protein:  70-80g/day  Fluid:  1.6-1.8L/day  Augustin Shams MS, RD, LDN If unable to be reached, please send secure chat to RD inpatient available from 8:00a-4:00p daily

## 2023-10-17 MED ORDER — NAPROXEN 250 MG PO TABS: 250.0000 mg | ORAL_TABLET | Freq: Three times a day (TID) | ORAL | 0 refills | Status: DC

## 2023-10-17 MED ORDER — RISPERIDONE 1 MG PO TABS: 1.0000 mg | ORAL_TABLET | Freq: Every day | ORAL | 0 refills | Status: DC

## 2023-10-17 MED ORDER — ADULT MULTIVITAMIN W/MINERALS CH: 1.0000 | ORAL_TABLET | Freq: Every day | ORAL | 0 refills | Status: AC

## 2023-10-17 MED ORDER — TRAZODONE HCL 50 MG PO TABS: 50.0000 mg | ORAL_TABLET | Freq: Every day | ORAL | 0 refills | Status: DC

## 2023-10-17 NOTE — Plan of Care (Signed)
  Problem: Education: Goal: Verbalization of understanding the information provided will improve Outcome: Progressing   Problem: Activity: Goal: Sleeping patterns will improve Outcome: Progressing   Problem: Health Behavior/Discharge Planning: Goal: Compliance with treatment plan for underlying cause of condition will improve Outcome: Progressing   Problem: Safety: Goal: Periods of time without injury will increase Outcome: Progressing   Problem: Education: Goal: Knowledge of the prescribed therapeutic regimen will improve Outcome: Progressing   Problem: Self-Care: Goal: Ability to participate in self-care as condition permits will improve Outcome: Progressing

## 2023-10-17 NOTE — Group Note (Signed)
 Date:  10/17/2023 Time:  10:57 AM  Group Topic/Focus:  Self Care:   The focus of this group is to help patients understand the importance of self-care in order to improve or restore emotional, physical, spiritual, interpersonal, and financial health. We also discussed health eating habits and hygiene.     Participation Level:  Did Not Attend  Sylvia Beasley 10/17/2023, 10:57 AM

## 2023-10-17 NOTE — Progress Notes (Signed)
   10/17/23 2100  Psych Admission Type (Psych Patients Only)  Admission Status Voluntary  Psychosocial Assessment  Patient Complaints None  Eye Contact Fair  Facial Expression Animated  Affect Appropriate to circumstance  Speech Logical/coherent  Interaction Assertive  Motor Activity Slow  Appearance/Hygiene In scrubs  Behavior Characteristics Cooperative;Appropriate to situation  Mood Pleasant  Thought Process  Coherency Concrete thinking  Content Blaming others  Delusions None reported or observed  Perception WDL  Hallucination None reported or observed  Judgment Impaired  Confusion WDL  Danger to Self  Current suicidal ideation? Denies  Danger to Others  Danger to Others None reported or observed   Mood/Behavior:  Pleasant and cooperative.     Psych assessment: Denies SI/HI and AVH.     Interaction / Group attendance:  Minimal interaction with peers and staff. Isolative. Did not attend group.   Medication/ PRNs: Compliant with scheduled medications. No PRNs required.   Pain: 3/10 chronic pain in hands. Pt refuses intervention at this time wanting to rest.   15 min checks in place for safety.

## 2023-10-17 NOTE — Progress Notes (Signed)
 [facility] MD Progress Note  10/17/2023 10:50 PM Sylvia Beasley  MRN:  [id number]  Sylvia Beasley is is a 58 y.o. female with no previous mental health history with symptoms concerning for mania. Given the rapid onset of symptoms would posit that initiation of methotrexate  may have contributed to this presentation as there are rare cases of methotrexate  induced psychosis that present as mania. However, would also be cautious to rule out medical contributors to altered mental status, which is more likely with a rapid onset of psychosis such as in this case. As such, it would be appropriate to order head imaging especially as patient has never previously exhibited a psychiatric decompensation. Given patient's significant lack of insight, she was unable to meaningfully engage in safety planning . Patient is admitted to [facility] unit with Q15 min safety monitoring. Multidisciplinary team approach is offered. Medication management; group/milieu therapy is offered.   Subjective:  Chart reviewed, case discussed in multidisciplinary meeting, patient seen during rounds.    10/17/2023: Today on interview patient is noted to be resting in bed.  She offers no complaints.  She reports getting some good sleep since her pain is under control.  She denies feeling depressed or anxious.  She denies SI/HI/plan.  She denies auditory/visual hallucinations.  She reports looking forward for her discharge.  She is tolerating medications with no reported side effects including EPS. 10/14: Patient is noted to be resting in bed.  She offers no complaints.  She reports that eating celery and beet root helps with the inflammation.  She reports with Naprosyn and her arthritis pain is under control and feels a lot relaxed.  She is not displaying any paranoid delusions today.  She remains upset about her relationship with her husband and reports that they will continue manage counseling after discharge.  She reports that her daughter is her main  support and she [place] to stay close to her daughter.  Patient denies SI/HI/plan.  Patient denies auditory/visual hallucinations. 10/13:Patient is noted to be resting in her bed.  She continues to display lack of insight into her mental health problems.  She remains discharge focused and wants to know when she will be discharged home and spite of extensive education on day-by-day basis about optimizing the medications.  Even though patient wanted to switch Zyprexa to another antipsychotic she remains concrete about that plan and demands discharge stating she took medications.  Provider educated patient that she will be receiving Risperdal for the first time tonight.  She talks about her job and the need for her to get out to be with her daughter.  Provider also discussed the concerns of her family.  Patient denies SI/HI/plan and denies auditory/visual hallucinations.    Sleep: Poor  Appetite:  Fair  Past Psychiatric History: see h&P Family History:  Family History  Problem Relation Age of Onset   Kidney disease Mother        died of ESRD   Diabetes Mother    Arthritis Father    Dementia Father    Asthma Sister    Breast cancer Sister 19   Glaucoma Sister    Diabetes Sister    Diabetes Sister        s/p BKA   Throat cancer Paternal Grandmother    Pancreatic cancer Paternal Uncle    Colon cancer Neg Hx    Social History:  Social History   Substance and Sexual Activity  Alcohol Use Never  Social History   Substance and Sexual Activity  Drug Use No    Social History   Socioeconomic History   Marital status: Married    Spouse name: Not on file   Number of children: 2   Years of education: Not on file   Highest education level: Associate degree: occupational, Scientist, product/process development, or vocational program  Occupational History   Not on file  Tobacco Use   Smoking status: Former    Current packs/day: 0.00    Types: Cigarettes    Quit  date: 01/03/1988    Years since quitting: 35.8   Smokeless tobacco: Never  Vaping Use   Vaping status: Never Used  Substance and Sexual Activity   Alcohol use: Never   Drug use: No   Sexual activity: Yes    Partners: Male    Birth control/protection: Post-menopausal  Other Topics Concern   Not on file  Social History Narrative   Not on file   Social Drivers of Health   Financial Resource Strain: Low Risk  (09/24/2023)   Received from [facility]   Overall Financial Resource Strain (CARDIA)    Difficulty of Paying Living Expenses: Not hard at all  Food Insecurity: No Food Insecurity (10/11/2023)   Hunger Vital Sign    Worried About Running Out of Food in the Last Year: Never true    Ran Out of Food in the Last Year: Never true  Transportation Needs: No Transportation Needs (10/11/2023)   PRAPARE - Administrator, Civil Service (Medical): No    Lack of Transportation (Non-Medical): No  Physical Activity: Sufficiently Active (03/01/2023)   Exercise Vital Sign    Days of Exercise per Week: 6 days    Minutes of Exercise per Session: 30 min  Stress: No Stress Concern Present (03/01/2023)   [employer] of Occupational Health - Occupational Stress Questionnaire    Feeling of Stress : Not at all  Social Connections: Moderately Integrated (03/01/2023)   Social Connection and Isolation Panel    Frequency of Communication with Friends and Family: More than three times a week    Frequency of Social Gatherings with Friends and Family: Once a week    Attends Religious Services: More than 4 times per year    Active Member of [employer] or Organizations: No    Attends Engineer, structural: Not on file    Marital Status: Married   Past Medical History:  Past Medical History:  Diagnosis Date   Allergy    Anemia    Arthritis    rheumatiod   Hypertension    Pre-diabetes     Past Surgical History:  Procedure Laterality Date   BREAST BIOPSY      BREAST CYST ASPIRATION Left    x 2   COLONOSCOPY WITH PROPOFOL  N/A 09/22/2021   Procedure: COLONOSCOPY WITH PROPOFOL ;  Surgeon: Therisa Bi, MD;  Location[facility];  Service: Gastroenterology;  Laterality: N/A;   EYE SURGERY Left 1980   stye removal   FOOT SURGERY Left 08/22/2023   HALLUX VALGUS AKIN Left 08/22/2023   Procedure: CORRECTION, HALLUX VALGUS, WITH AKIN OSTEOTOMY;  Surgeon: Ashley Soulier, DPM;  Location: [facility];  Service: Orthopedics/Podiatry;  Laterality: Left;   HALLUX VALGUS LAPIDUS Left 08/22/2023   Procedure: ROMAYNE LOOK;  Surgeon: Ashley Soulier, DPM;  Location: [facility];  Service: Orthopedics/Podiatry;  Laterality: Left;   TUBAL LIGATION  2007    Current Medications: Current Facility-Administered Medications  Medication Dose Route  Frequency Provider Last Rate Last Admin   acetaminophen  (TYLENOL ) tablet 650 mg  650 mg Oral Q6H PRN Trudy Carwin, NP   650 mg at 10/16/23 2249   alum & mag hydroxide-simeth (MAALOX/MYLANTA) 200-200-20 MG/5ML suspension 30 mL  30 mL Oral Q4H PRN Trudy Carwin, NP       folic acid  (FOLVITE ) tablet 1 mg  1 mg Oral Daily Valinda Fedie, MD   1 mg at 10/17/23 1009   hydrALAZINE (APRESOLINE) tablet 25 mg  25 mg Oral Q8H PRN Brentley Landfair, MD   25 mg at 10/13/23 2126   losartan  (COZAAR ) tablet 50 mg  50 mg Oral Daily Karem Tomaso, MD   50 mg at 10/17/23 1009   magnesium hydroxide (MILK OF MAGNESIA) suspension 30 mL  30 mL Oral Daily PRN Trudy Carwin, NP       metoprolol  succinate (TOPROL -XL) 24 hr tablet 25 mg  25 mg Oral Daily Jillana Selph, MD   25 mg at 10/17/23 1009   multivitamin with minerals tablet 1 tablet  1 tablet Oral Daily Litha Lamartina, MD   1 tablet at 10/17/23 1009   naproxen (NAPROSYN) tablet 250 mg  250 mg Oral TID WC Doniel Maiello, MD   250 mg at 10/17/23 1752   OLANZapine zydis (ZYPREXA) disintegrating tablet 5 mg  5 mg Oral TID PRN Trudy Carwin, NP   5 mg at 10/11/23 1956    protein supplement (ENSURE MAX) liquid  11 oz Oral BID Lezlie Ritchey, MD   11 oz at 10/16/23 1311   pyridOXINE (VITAMIN B6) tablet 25 mg  25 mg Oral Daily Genesi Stefanko, MD   25 mg at 10/17/23 1009   risperiDONE (RISPERDAL) tablet 1 mg  1 mg Oral QHS Tilia Faso, MD   1 mg at 10/17/23 2147   traZODone  (DESYREL ) tablet 50 mg  50 mg Oral QHS McLauchlin, Angela, NP   50 mg at 10/17/23 2147    Lab Results:  No results found for this or any previous visit (from the past 48 hours).   Blood Alcohol level:  Lab Results  Component Value Date   [facility] <15 10/10/2023    Metabolic Disorder Labs: Lab Results  Component Value Date   HGBA1C 5.9 (H) 08/02/2023   No results found for: PROLACTIN Lab Results  Component Value Date   CHOL 179 08/02/2023   TRIG 68 08/02/2023   HDL 60 08/02/2023   CHOLHDL 3.0 08/02/2023   LDLCALC 106 (H) 08/02/2023   LDLCALC 119 (H) 08/21/2022    Physical Findings: AIMS:  , ,  ,  ,    CIWA:    COWS:      Psychiatric Specialty Exam:  Presentation  General Appearance: Appropriate for Environment; Casual  Eye Contact:Fair  Speech:Clear and Coherent  Speech Volume:Normal    Mood and Affect  Mood:Euthymic  Affect:Appropriate   Thought Process  Thought Processes:Coherent  Descriptions of Associations:Intact  Orientation:Full (Time, Place and Person)  Thought Content:Logical  Hallucinations:denies  Ideas of Reference:None  Suicidal Thoughts:denies  Homicidal Thoughts:denies   Sensorium  Memory:Immediate Fair; Recent Fair  Judgment:Fair  Insight:Fair   Executive Functions  Concentration:Fair  Attention Span:Fair  Recall:Fair  Fund of Knowledge:Fair  Language:Fair   Psychomotor Activity  Psychomotor Activity:Psychomotor Activity: Normal   Musculoskeletal: Strength & Muscle Tone: within normal limits Gait & Station: normal Assets  Assets:Communication Skills; Desire for Improvement; Resilience; Social  Support    Physical Exam: Physical Exam Vitals and nursing note reviewed.    ROS  Blood pressure 139/82, pulse 73, temperature 97.7 F (36.5 C), resp. rate 14, height 5' 3 (1.6 m), weight 58.5 kg, last menstrual period 05/22/2015, SpO2 98%. Body mass index is 22.85 kg/m.  Diagnosis: Principal Problem:   Psychosis [facility])   Clinical Decision Making: Patient is currently admitted for worsening paranoia and aggressive behaviors in the community with a possible contribution from her medication methotrexate  that she has been taking for a month.  Patient needs close monitoring.  Treatment Plan Summary:  Safety and Monitoring:             -- Involuntary admission to inpatient psychiatric unit for safety, stabilization and treatment             -- Daily contact with patient to assess and evaluate symptoms and progress in treatment             -- Patient's case to be discussed in multi-disciplinary team meeting             -- Observation Level: q15 minute checks             -- Vital signs:  q12 hours             -- Precautions: suicide, elopement, and assault   2. Psychiatric Diagnoses and Treatment:              Discontinue Zyprexa 5 mg twice daily as patient reports dizziness and unstable gait Risperdal 1 mg nightly is initiated Rheumatoid arthritis-Naprosyn will be tried to reduce inflammation and until her discharge.     -- The risks/benefits/side-effects/alternatives to this medication were discussed in detail with the patient and time was given for questions. The patient consents to medication trial.                -- Metabolic profile and EKG monitoring obtained while on an atypical antipsychotic (BMI: Lipid Panel: HbgA1c: QTc:)              -- Encouraged patient to participate in unit milieu and in scheduled group therapies                            3. Medical Issues Being Addressed:   4. Discharge Planning:   -- Social work and case management to assist with discharge  planning and identification of hospital follow-up needs prior to discharge  -- Estimated LOS: 3-4 days  Allyn Foil, MD 10/17/2023, 10:50 PM

## 2023-10-17 NOTE — Group Note (Signed)
 Date:  10/17/2023 Time:  9:33 PM  Group Topic/Focus:  Wrap-Up Group:   The focus of this group is to help patients review their daily goal of treatment and discuss progress on daily workbooks.    Participation Level:  Did Not Attend  Participation Quality:     Affect:     Cognitive:     Insight: None  Engagement in Group:  None  Modes of Intervention:     Additional Comments:    Sylvia Beasley 10/17/2023, 9:33 PM

## 2023-10-17 NOTE — Progress Notes (Signed)
   10/17/23 0200  Psych Admission Type (Psych Patients Only)  Admission Status Involuntary  Psychosocial Assessment  Patient Complaints None  Eye Contact Fair  Facial Expression Animated  Affect Appropriate to circumstance  Speech Logical/coherent  Interaction Assertive  Motor Activity Slow  Appearance/Hygiene In scrubs  Behavior Characteristics Cooperative  Mood Pleasant  Thought Process  Coherency Concrete thinking  Content Blaming others  Delusions None reported or observed  Perception WDL  Hallucination None reported or observed  Judgment Impaired  Confusion WDL  Danger to Self  Current suicidal ideation? Denies  Danger to Others  Danger to Others None reported or observed

## 2023-10-17 NOTE — Plan of Care (Signed)
  Problem: Education: Goal: Knowledge of [facility] Education information/materials will improve Outcome: Adequate for Discharge Goal: Emotional status will improve Outcome: Adequate for Discharge Goal: Mental status will improve Outcome: Adequate for Discharge Goal: Verbalization of understanding the information provided will improve Outcome: Adequate for Discharge   Problem: Activity: Goal: Interest or engagement in activities will improve Outcome: Adequate for Discharge Goal: Sleeping patterns will improve Outcome: Adequate for Discharge   Problem: Coping: Goal: Ability to verbalize frustrations and anger appropriately will improve Outcome: Adequate for Discharge Goal: Ability to demonstrate self-control will improve Outcome: Adequate for Discharge   Problem: Health Behavior/Discharge Planning: Goal: Identification of resources available to assist in meeting health care needs will improve Outcome: Adequate for Discharge Goal: Compliance with treatment plan for underlying cause of condition will improve Outcome: Adequate for Discharge   Problem: Physical Regulation: Goal: Ability to maintain clinical measurements within normal limits will improve Outcome: Adequate for Discharge   Problem: Safety: Goal: Periods of time without injury will increase Outcome: Adequate for Discharge   Problem: Activity: Goal: Will verbalize the importance of balancing activity with adequate rest periods Outcome: Adequate for Discharge   Problem: Education: Goal: Will be free of psychotic symptoms Outcome: Adequate for Discharge Goal: Knowledge of the prescribed therapeutic regimen will improve Outcome: Adequate for Discharge   Problem: Coping: Goal: Coping ability will improve Outcome: Adequate for Discharge Goal: Will verbalize feelings Outcome: Adequate for Discharge   Problem: Self-Care: Goal: Ability to participate in self-care as condition permits will improve Outcome:  Adequate for Discharge

## 2023-10-17 NOTE — BHH Suicide Risk Assessment (Signed)
 [facility] Discharge Suicide Risk Assessment   Principal Problem: Psychosis [facility]) Discharge Diagnoses: Principal Problem:   Psychosis (HCC)   Total Time spent with patient: 30 minutes  Musculoskeletal: Strength & Muscle Tone: within normal limits Gait & Station: normal Patient leans: N/A  Psychiatric Specialty Exam  Presentation  General Appearance:  Appropriate for Environment; Casual  Eye Contact: Fair  Speech: Clear and Coherent  Speech Volume: Normal  Handedness: Right   Mood and Affect  Mood: Euthymic  Duration of Depression Symptoms: No data recorded Affect: Appropriate   Thought Process  Thought Processes: Coherent  Descriptions of Associations:Intact  Orientation:Full (Time, Place and Person)  Thought Content:Logical  History of Schizophrenia/Schizoaffective disorder:No  Duration of Psychotic Symptoms:Less than six months  Hallucinations:Hallucinations: None  Ideas of Reference:None  Suicidal Thoughts:Suicidal Thoughts: No  Homicidal Thoughts:Homicidal Thoughts: No   Sensorium  Memory: Immediate Fair; Recent Fair  Judgment: Fair  Insight: Fair   Art therapist  Concentration: Fair  Attention Span: Fair  Recall: [employer] of Knowledge: Fair  Language: Fair   Psychomotor Activity  Psychomotor Activity: Psychomotor Activity: Normal   Assets  Assets: Communication Skills; Desire for Improvement; Resilience; Social Support   Sleep  Sleep: Sleep: Fair  Estimated Sleeping Duration (Last 24 Hours): 11.25-14.00 hours  Physical Exam: Physical Exam Vitals and nursing note reviewed.    ROS Blood pressure 139/82, pulse 73, temperature 97.7 F (36.5 C), resp. rate 14, height 5' 3 (1.6 m), weight 58.5 kg, last menstrual period 05/22/2015, SpO2 98%. Body mass index is 22.85 kg/m.  Mental Status Per Nursing Assessment::   On Admission:  NA  Demographic Factors:  Low socioeconomic status  Loss  Factors: Decrease in vocational status  Historical Factors: Impulsivity  Risk Reduction Factors:   Living with another person, especially a relative, Positive social support, Positive therapeutic relationship, and Positive coping skills or problem solving skills  Continued Clinical Symptoms:  Currently Psychotic  Cognitive Features That Contribute To Risk:  None    Suicide Risk:  Minimal: No identifiable suicidal ideation.  Patients presenting with no risk factors but with morbid ruminations; may be classified as minimal risk based on the severity of the depressive symptoms   Follow-up Information     [facility]. Go on 10/22/2023.   Why: You are scheduled for therapy with Joen Hummer in-person on 10/22/23 at 2 PM. You will receive an email about how to register for the patient portal. Contact information: [address] [place] [address] [phone]         [facility] [place]. Go on 12/13/2023.   Why: You are scheduled for medication management for 12/13/23 at 3:30 PM VIRTUALLY.  You will receive an email about how to register for the patient portal. Contact information: [address] [place] [address] [phone]                 Plan Of Care/Follow-up recommendations:  Activity:  as tolerated  Allyn Foil, MD 10/17/2023, 10:58 PM

## 2023-10-17 NOTE — Progress Notes (Signed)
   10/17/23 1600  Psych Admission Type (Psych Patients Only)  Admission Status Involuntary  Psychosocial Assessment  Patient Complaints None  Eye Contact Fair  Facial Expression Animated  Affect Appropriate to circumstance  Speech Logical/coherent  Interaction Assertive  Motor Activity Slow  Appearance/Hygiene In scrubs  Behavior Characteristics Cooperative  Mood Pleasant  Thought Process  Coherency Concrete thinking  Content Blaming others  Delusions None reported or observed  Perception WDL  Hallucination None reported or observed  Judgment Impaired  Confusion WDL  Danger to Self  Current suicidal ideation? Denies  Danger to Others  Danger to Others None reported or observed

## 2023-10-17 NOTE — Plan of Care (Signed)
  Problem: Education: Goal: Knowledge of [facility] Education information/materials will improve Outcome: Progressing Goal: Emotional status will improve Outcome: Progressing Goal: Mental status will improve Outcome: Progressing Goal: Verbalization of understanding the information provided will improve Outcome: Progressing   Problem: Activity: Goal: Interest or engagement in activities will improve Outcome: Progressing Goal: Sleeping patterns will improve Outcome: Progressing   Problem: Coping: Goal: Ability to verbalize frustrations and anger appropriately will improve Outcome: Progressing Goal: Ability to demonstrate self-control will improve Outcome: Progressing   Problem: Health Behavior/Discharge Planning: Goal: Identification of resources available to assist in meeting health care needs will improve Outcome: Progressing Goal: Compliance with treatment plan for underlying cause of condition will improve Outcome: Progressing   Problem: Physical Regulation: Goal: Ability to maintain clinical measurements within normal limits will improve Outcome: Progressing   Problem: Safety: Goal: Periods of time without injury will increase Outcome: Progressing   Problem: Activity: Goal: Will verbalize the importance of balancing activity with adequate rest periods Outcome: Progressing   Problem: Education: Goal: Will be free of psychotic symptoms Outcome: Progressing Goal: Knowledge of the prescribed therapeutic regimen will improve Outcome: Progressing   Problem: Coping: Goal: Coping ability will improve Outcome: Progressing Goal: Will verbalize feelings Outcome: Progressing   Problem: Self-Care: Goal: Ability to participate in self-care as condition permits will improve Outcome: Progressing

## 2023-10-17 NOTE — Progress Notes (Incomplete)
 Mood/Behavior:  Pleasant and cooperative.     Psych assessment: Denies SI/HI and AVH.     Interaction / Group attendance:  Minimal interaction with peers and staff. Isolative.    Medication/ PRNs: Compliant with scheduled medications. No PRNs given.   Pain: 3/10 chronic pain hands refuses intervention at this time wanting to rest.   15 min checks in place for safety.

## 2023-10-17 NOTE — BH IP Treatment Plan (Signed)
 Interdisciplinary Treatment and Diagnostic Plan Update  10/17/2023 Time of Session: 9:00 AM Sylvia Beasley MRN: [id number]  Principal Diagnosis: Psychosis [facility])  Secondary Diagnoses: Principal Problem:   Psychosis (HCC)   Current Medications:  Current Facility-Administered Medications  Medication Dose Route Frequency Provider Last Rate Last Admin   acetaminophen  (TYLENOL ) tablet 650 mg  650 mg Oral Q6H PRN Trudy Carwin, NP   650 mg at 10/16/23 2249   alum & mag hydroxide-simeth (MAALOX/MYLANTA) 200-200-20 MG/5ML suspension 30 mL  30 mL Oral Q4H PRN Trudy Carwin, NP       folic acid  (FOLVITE ) tablet 1 mg  1 mg Oral Daily Jadapalle, Sree, MD   1 mg at 10/17/23 1009   hydrALAZINE (APRESOLINE) tablet 25 mg  25 mg Oral Q8H PRN Jadapalle, Sree, MD   25 mg at 10/13/23 2126   losartan  (COZAAR ) tablet 50 mg  50 mg Oral Daily Jadapalle, Sree, MD   50 mg at 10/17/23 1009   magnesium hydroxide (MILK OF MAGNESIA) suspension 30 mL  30 mL Oral Daily PRN Trudy Carwin, NP       metoprolol  succinate (TOPROL -XL) 24 hr tablet 25 mg  25 mg Oral Daily Jadapalle, Sree, MD   25 mg at 10/17/23 1009   multivitamin with minerals tablet 1 tablet  1 tablet Oral Daily Jadapalle, Sree, MD   1 tablet at 10/17/23 1009   naproxen (NAPROSYN) tablet 250 mg  250 mg Oral TID WC Jadapalle, Sree, MD   250 mg at 10/17/23 1143   OLANZapine zydis (ZYPREXA) disintegrating tablet 5 mg  5 mg Oral TID PRN Trudy Carwin, NP   5 mg at 10/11/23 1956   protein supplement (ENSURE MAX) liquid  11 oz Oral BID Jadapalle, Sree, MD   11 oz at 10/16/23 1311   pyridOXINE (VITAMIN B6) tablet 25 mg  25 mg Oral Daily Jadapalle, Sree, MD   25 mg at 10/17/23 1009   risperiDONE (RISPERDAL) tablet 1 mg  1 mg Oral QHS Jadapalle, Sree, MD   1 mg at 10/16/23 2150   traZODone  (DESYREL ) tablet 50 mg  50 mg Oral QHS McLauchlin, Angela, NP   50 mg at 10/16/23 2150   PTA Medications: Medications Prior to Admission  Medication Sig Dispense Refill Last  Dose/Taking   ACCU-CHEK GUIDE TEST test strip as directed.      aspirin  EC 81 MG tablet Take 1 tablet (81 mg total) by mouth in the morning and at bedtime. Swallow whole. (Patient not taking: Reported on 10/10/2023) 60 tablet 12    BLACK COHOSH COMPOUND PO Take 1 tablet by mouth at bedtime.      Cats Claw, Uncaria Tomentosa, (CATS CLAW PO) Take 1 tablet by mouth daily.      folic acid  (FOLVITE ) 1 MG tablet Take 1 mg by mouth daily.      losartan  (COZAAR ) 50 MG tablet TAKE 1 TABLET BY MOUTH EVERY DAY 90 tablet 1    meloxicam  (MOBIC ) 15 MG tablet Take 1 tablet (15 mg total) by mouth daily. 30 tablet 1    methotrexate  (RHEUMATREX) 2.5 MG tablet Take 12.5 mg by mouth once a week. Tuesday      metoprolol  succinate (TOPROL -XL) 25 MG 24 hr tablet TAKE 1 TABLET (25 MG TOTAL) BY MOUTH DAILY. 90 tablet 1    Moringa 500 MG CAPS Take 1,500 mg by mouth.      pyridOXINE (VITAMIN B6) 25 MG tablet Take 25 mg by mouth daily.      traZODone  (DESYREL )  50 MG tablet Take 0.5-1 tablets (25-50 mg total) by mouth at bedtime as needed for sleep. 30 tablet 3    triamcinolone cream (KENALOG) 0.5 % Apply topically. (Patient not taking: Reported on 10/10/2023)      Turmeric (QC TUMERIC COMPLEX) 500 MG CAPS Take 1,000 mg by mouth.       Patient Stressors: Health problems   Marital or family conflict    Patient Strengths: Printmaker for treatment/growth  Supportive family/friends   Treatment Modalities: Medication Management, Group therapy, Case management,  1 to 1 session with clinician, Psychoeducation, Recreational therapy.   Physician Treatment Plan for Primary Diagnosis: Psychosis (HCC) Long Term Goal(s): Improvement in symptoms so as ready for discharge   Short Term Goals: Ability to identify changes in lifestyle to reduce recurrence of condition will improve Ability to verbalize feelings will improve Ability to disclose and discuss suicidal ideas Ability to demonstrate self-control will  improve Ability to identify and develop effective coping behaviors will improve  Medication Management: Evaluate patient's response, side effects, and tolerance of medication regimen.  Therapeutic Interventions: 1 to 1 sessions, Unit Group sessions and Medication administration.  Evaluation of Outcomes: Progressing  Physician Treatment Plan for Secondary Diagnosis: Principal Problem:   Psychosis (HCC)  Long Term Goal(s): Improvement in symptoms so as ready for discharge   Short Term Goals: Ability to identify changes in lifestyle to reduce recurrence of condition will improve Ability to verbalize feelings will improve Ability to disclose and discuss suicidal ideas Ability to demonstrate self-control will improve Ability to identify and develop effective coping behaviors will improve     Medication Management: Evaluate patient's response, side effects, and tolerance of medication regimen.  Therapeutic Interventions: 1 to 1 sessions, Unit Group sessions and Medication administration.  Evaluation of Outcomes: Progressing   RN Treatment Plan for Primary Diagnosis: Psychosis (HCC) Long Term Goal(s): Knowledge of disease and therapeutic regimen to maintain health will improve  Short Term Goals: Ability to remain free from injury will improve, Ability to verbalize frustration and anger appropriately will improve, Ability to demonstrate self-control, Ability to participate in decision making will improve, Ability to verbalize feelings will improve, Ability to disclose and discuss suicidal ideas, Ability to identify and develop effective coping behaviors will improve, and Compliance with prescribed medications will improve   Medication Management: RN will administer medications as ordered by provider, will assess and evaluate patient's response and provide education to patient for prescribed medication. RN will report any adverse and/or side effects to prescribing provider.  Therapeutic  Interventions: 1 on 1 counseling sessions, Psychoeducation, Medication administration, Evaluate responses to treatment, Monitor vital signs and CBGs as ordered, Perform/monitor CIWA, COWS, AIMS and Fall Risk screenings as ordered, Perform wound care treatments as ordered.  Evaluation of Outcomes: Progressing   LCSW Treatment Plan for Primary Diagnosis: Psychosis (HCC) Long Term Goal(s): Safe transition to appropriate next level of care at discharge, Engage patient in therapeutic group addressing interpersonal concerns.  Short Term Goals:  Engage patient in aftercare planning with referrals and resources, Increase social support, Increase ability to appropriately verbalize feelings, Increase emotional regulation, Facilitate acceptance of mental health diagnosis and concerns, Facilitate patient progression through stages of change regarding substance use diagnoses and concerns, Identify triggers associated with mental health/substance abuse issues, and Increase skills for wellness and recovery   Therapeutic Interventions: Assess for all discharge needs, 1 to 1 time with Social worker, Explore available resources and support systems, Assess for adequacy in community support network, Educate  family and significant other(s) on suicide prevention, Complete Psychosocial Assessment, Interpersonal group therapy.  Evaluation of Outcomes: Progressing   Progress in Treatment: Attending groups: Yes. and No. 10/17/23 Update: Yes. and No.  Participating in groups: Yes. and No. 10/17/23 Update: Yes. and No.  Taking medication as prescribed: Yes. 10/17/23 Update: Yes.  Toleration medication: Yes. 10/17/23 Update: Yes.  Family/Significant other contact made: No, will contact:  CSW will contact if given permission 10/17/23 Update: Yes. Sylvia Beasley, husband, [phone]  Patient understands diagnosis: Yes. 10/17/23 Update: Yes.  Discussing patient identified problems/goals with staff: Yes. 10/17/23 Update: Yes.   Medical problems stabilized or resolved: Yes. 10/17/23 Update: Yes.  Denies suicidal/homicidal ideation: Yes. 10/17/23 Update: Yes.  Issues/concerns per patient self-inventory: No. 10/17/23 Update: No.  Other: None 10/17/23 Update: None.   New problem(s) identified: No, Describe:  None identified 10/17/23 Update: No, Describe: None identified    New Short Term/Long Term Goal(s):  elimination of symptoms of psychosis, medication management for mood stabilization; elimination of SI thoughts; development of comprehensive mental wellness plan.    Patient Goals:  Just to get clear in my head 10/17/23 Update: Patient goal to remain the same.   Discharge Plan or Barriers:  CSW will assist with appropriate discharge planning. 10/17/23 Update: CSW to continue to assist patient with resource referral to ensure strengthened support system to prepare for safe discharge.     Reason for Continuation of Hospitalization: Medication stabilization   Estimated Length of Stay: 1 to 7 days 10/17/23 Update: Pending 10/18/23 or TBD.   Last 3 [place] Suicide Severity Risk Score: Flowsheet Row Admission (Current) from 10/11/2023 in [facility] ED from 10/10/2023 in [facility] Emergency Department at [facility] Admission (Discharged) from 08/22/2023 in [facility] PERIOP  C-SSRS RISK CATEGORY No Risk No Risk No Risk    Last PHQ 2/9 Scores:    08/02/2023    8:30 AM 05/18/2023    1:27 PM 03/05/2023   11:14 AM  Depression screen PHQ 2/9  Decreased Interest 0 0 0  Down, Depressed, Hopeless 0 0 0  PHQ - 2 Score 0 0 0  Altered sleeping  1   Tired, decreased energy  0   Change in appetite  0   Feeling bad or failure about yourself   0   Trouble concentrating  0   Moving slowly or fidgety/restless  0   Suicidal thoughts  0   PHQ-9 Score  1   Difficult doing work/chores  Not difficult at all     Scribe for Treatment Team: Katasha Riga M Ahijah Devery,  LCSW 10/17/2023 1:46 PM

## 2023-10-18 NOTE — Discharge Summary (Signed)
 Physician Discharge Summary Note  Patient:  Sylvia Beasley is an 58 y.o., female MRN:  [id number] DOB:  [birth year] Patient phone:  [phone] (home)  Patient address:   [address] [place] [address],   Total time spent: 40 min Date of Admission:  10/11/2023 Date of Discharge: 10/18/23  Reason for Admission:  Sylvia Beasley is is a 58 y.o. female with no previous mental health history with symptoms concerning for mania. Given the rapid onset of symptoms would posit that initiation of methotrexate  may have contributed to this presentation as there are rare cases of methotrexate  induced psychosis that present as mania. However, would also be cautious to rule out medical contributors to altered mental status, which is more likely with a rapid onset of psychosis such as in this case. As such, it would be appropriate to order head imaging especially as patient has never previously exhibited a psychiatric decompensation. Given patient's significant lack of insight, she was unable to meaningfully engage in safety planning . Patient is admitted to [facility] unit with Q15 min safety monitoring. Multidisciplinary team approach is offered. Medication management; group/milieu therapy is offered.   Principal Problem: Psychosis [facility]) Discharge Diagnoses: Principal Problem:   Psychosis (HCC)   Past Psychiatric History: see h&p  Family Psychiatric  History: see h&p Social History:  Social History   Substance and Sexual Activity  Alcohol Use Never     Social History   Substance and Sexual Activity  Drug Use No    Social History   Socioeconomic History   Marital status: Married    Spouse name: Not on file   Number of children: 2   Years of education: Not on file   Highest education level: Associate degree: occupational, Scientist, product/process development, or vocational program  Occupational History   Not on file  Tobacco Use   Smoking status: Former    Current packs/day: 0.00    Types: Cigarettes    Quit  date: 01/03/1988    Years since quitting: 35.8   Smokeless tobacco: Never  Vaping Use   Vaping status: Never Used  Substance and Sexual Activity   Alcohol use: Never   Drug use: No   Sexual activity: Yes    Partners: Male    Birth control/protection: Post-menopausal  Other Topics Concern   Not on file  Social History Narrative   Not on file   Social Drivers of Health   Financial Resource Strain: Low Risk  (09/24/2023)   Received from [facility]   Overall Financial Resource Strain (CARDIA)    Difficulty of Paying Living Expenses: Not hard at all  Food Insecurity: No Food Insecurity (10/11/2023)   Hunger Vital Sign    Worried About Running Out of Food in the Last Year: Never true    Ran Out of Food in the Last Year: Never true  Transportation Needs: No Transportation Needs (10/11/2023)   PRAPARE - Administrator, Civil Service (Medical): No    Lack of Transportation (Non-Medical): No  Physical Activity: Sufficiently Active (03/01/2023)   Exercise Vital Sign    Days of Exercise per Week: 6 days    Minutes of Exercise per Session: 30 min  Stress: No Stress Concern Present (03/01/2023)   [employer] of Occupational Health - Occupational Stress Questionnaire    Feeling of Stress : Not at all  Social Connections: Moderately Integrated (03/01/2023)   Social Connection and Isolation Panel    Frequency of Communication with Friends and Family: More than  three times a week    Frequency of Social Gatherings with Friends and Family: Once a week    Attends Religious Services: More than 4 times per year    Active Member of [employer] or Organizations: No    Attends Engineer, structural: Not on file    Marital Status: Married   Past Medical History:  Past Medical History:  Diagnosis Date   Allergy    Anemia    Arthritis    rheumatiod   Hypertension    Pre-diabetes     Past Surgical History:  Procedure Laterality Date   BREAST BIOPSY      BREAST CYST ASPIRATION Left    x 2   COLONOSCOPY WITH PROPOFOL  N/A 09/22/2021   Procedure: COLONOSCOPY WITH PROPOFOL ;  Surgeon: Therisa Bi, MD;  Location[facility] END[facility];  Service: Gastroenterology;  Laterality: N/A;   EYE SURGERY Left 1980   stye removal   FOOT SURGERY Left 08/22/2023   HALLUX VALGUS AKIN Left 08/22/2023   Procedure: CORRECTION, HALLUX VALGUS, WITH AKIN OSTEOTOMY;  Surgeon: Ashley Soulier, DPM;  Location: [facility];  Service: Orthopedics/Podiatry;  Laterality: Left;   HALLUX VALGUS LAPIDUS Left 08/22/2023   Procedure: ROMAYNE LOOK;  Surgeon: Ashley Soulier, DPM;  Location: [facility];  Service: Orthopedics/Podiatry;  Laterality: Left;   TUBAL LIGATION  2007   Family History:  Family History  Problem Relation Age of Onset   Kidney disease Mother        died of ESRD   Diabetes Mother    Arthritis Father    Dementia Father    Asthma Sister    Breast cancer Sister 86   Glaucoma Sister    Diabetes Sister    Diabetes Sister        s/p BKA   Throat cancer Paternal Grandmother    Pancreatic cancer Paternal Uncle    Colon cancer Neg Hx     Hospital Course:  Sylvia Beasley is is a 58 y.o. female with no previous mental health history with symptoms concerning for mania. Given the rapid onset of symptoms would posit that initiation of methotrexate  may have contributed to this presentation as there are rare cases of methotrexate  induced psychosis that present as mania. However, would also be cautious to rule out medical contributors to altered mental status, which is more likely with a rapid onset of psychosis such as in this case. As such, it would be appropriate to order head imaging especially as patient has never previously exhibited a psychiatric decompensation. Given patient's significant lack of insight, she was unable to meaningfully engage in safety planning . Patient is admitted to [facility] unit with Q15 min safety monitoring.  Multidisciplinary team approach is offered. Medication management; group/milieu therapy is offered.  Detailed risk assessment is complete based on clinical exam and individual risk factors and acute suicide risk is low and acute violence risk is low.    On admission, patient is noted to be very paranoid and delusional about her neighborhood and her relationship with her husband and the reason for the visit.  Patient is aware that methotrexate  has been discontinued as it was considered as one of the possibilities for new onset psychosis.  Patient was initially started on Zyprexa 5 mg twice daily.  Patient reported excessive sedation.  Patient preferred to be on Risperdal at night.  Zyprexa was discontinued and patient was maintained on Risperdal 1 mg nightly.  Patient tolerated medications very well and maintain safe behaviors on the  unit.  Patient mood improved and paranoia resolved.  Family visited her very regularly and is noted to be back to her baseline.  On the day of discharge patient denies SI/HI/plan and denies auditory/visual hallucinations.  She remains future oriented and is willing to participate in outpatient mental health services. Currently, all modifiable risk of harm to self/harm to others have been addressed and patient is no longer appropriate for the acute inpatient setting and is able to continue treatment for mental health needs in the community with the supports as indicated below.  Patient is educated and verbalized understanding of discharge plan of care including medications, follow-up appointments, mental health resources and further crisis services in the community.  He is instructed to call 911 or present to the nearest emergency room should he experience any decompensation in mood, disturbance of bowel or return of suicidal/homicidal ideations.  Patient verbalizes understanding of this education and agrees to this plan of care  Physical Findings: AIMS:  , ,  ,  ,    CIWA:     COWS:        Psychiatric Specialty Exam:  Presentation  General Appearance:  Appropriate for Environment; Casual  Eye Contact: Fair  Speech: Clear and Coherent  Speech Volume: Normal    Mood and Affect  Mood: Euthymic  Affect: Appropriate   Thought Process  Thought Processes: Coherent  Descriptions of Associations:Intact  Orientation:Full (Time, Place and Person)  Thought Content:Logical  Hallucinations:Hallucinations: None  Ideas of Reference:None  Suicidal Thoughts:Suicidal Thoughts: No  Homicidal Thoughts:Homicidal Thoughts: No   Sensorium  Memory: Immediate Fair; Recent Fair  Judgment: Fair  Insight: Fair   Art therapist  Concentration: Fair  Attention Span: Fair  Recall: [employer] of Knowledge: Fair  Language: Fair   Psychomotor Activity  Psychomotor Activity: Psychomotor Activity: Normal  Musculoskeletal: Strength & Muscle Tone: within normal limits Gait & Station: normal Assets  Assets: Manufacturing systems engineer; Desire for Improvement; Resilience; Social Support   Sleep  Sleep: Sleep: Fair    Physical Exam: Physical Exam ROS Blood pressure (!) 140/91, pulse (!) 117, temperature 98.3 F (36.8 C), resp. rate 14, height 5' 3 (1.6 m), weight 58.5 kg, last menstrual period 05/22/2015, SpO2 97%. Body mass index is 22.85 kg/m.   Social History   Tobacco Use  Smoking Status Former   Current packs/day: 0.00   Types: Cigarettes   Quit date: 01/03/1988   Years since quitting: 35.8  Smokeless Tobacco Never   Tobacco Cessation:  N/A, patient does not currently use tobacco products   Blood Alcohol level:  Lab Results  Component Value Date   [facility] <15 10/10/2023    Metabolic Disorder Labs:  Lab Results  Component Value Date   HGBA1C 5.9 (H) 08/02/2023   No results found for: PROLACTIN Lab Results  Component Value Date   CHOL 179 08/02/2023   TRIG 68 08/02/2023   HDL 60 08/02/2023   CHOLHDL  3.0 08/02/2023   LDLCALC 106 (H) 08/02/2023   LDLCALC 119 (H) 08/21/2022    See Psychiatric Specialty Exam and Suicide Risk Assessment completed by Attending Physician prior to discharge.  Discharge destination:  Home  Is patient on multiple antipsychotic therapies at discharge:  No   Has Patient had three or more failed trials of antipsychotic monotherapy by history:  No  Recommended Plan for Multiple Antipsychotic Therapies: NA   Allergies as of 10/18/2023       Reactions   Shellfish Allergy Shortness Of Breath   Zyrtec [  cetirizine] Shortness Of Breath   Ace Inhibitors Other (See Comments)   Ipratropium Bromide Other (See Comments)   Milk-related Compounds    Fexofenadine Rash   Allegra   Rofecoxib Rash   Vioxx        Medication List     STOP taking these medications    methotrexate  2.5 MG tablet Commonly known as: RHEUMATREX       TAKE these medications      Indication  Accu-Chek Guide Test test strip Generic drug: glucose blood as directed.    aspirin  EC 81 MG tablet Take 1 tablet (81 mg total) by mouth in the morning and at bedtime. Swallow whole.    BLACK COHOSH COMPOUND PO Take 1 tablet by mouth at bedtime.    CATS CLAW PO Take 1 tablet by mouth daily.    folic acid  1 MG tablet Commonly known as: FOLVITE  Take 1 mg by mouth daily.    losartan  50 MG tablet Commonly known as: COZAAR  TAKE 1 TABLET BY MOUTH EVERY DAY    meloxicam  15 MG tablet Commonly known as: MOBIC  Take 1 tablet (15 mg total) by mouth daily.    metoprolol  succinate 25 MG 24 hr tablet Commonly known as: TOPROL -XL TAKE 1 TABLET (25 MG TOTAL) BY MOUTH DAILY.    Moringa 500 MG Caps Take 1,500 mg by mouth.    multivitamin with minerals Tabs tablet Take 1 tablet by mouth daily.    naproxen 250 MG tablet Commonly known as: NAPROSYN Take 1 tablet (250 mg total) by mouth 3 (three) times daily with meals.  Indication: Joint Damage causing Pain and Loss of Function    pyridOXINE 25 MG tablet Commonly known as: VITAMIN B6 Take 25 mg by mouth daily.    QC Tumeric Complex 500 MG Caps Generic drug: Turmeric Take 1,000 mg by mouth.    risperiDONE 1 MG tablet Commonly known as: RISPERDAL Take 1 tablet (1 mg total) by mouth at bedtime.  Indication: Delusions   traZODone  50 MG tablet Commonly known as: DESYREL  Take 1 tablet (50 mg total) by mouth at bedtime. What changed:  how much to take when to take this reasons to take this  Indication: Trouble Sleeping   triamcinolone cream 0.5 % Commonly known as: KENALOG Apply topically.         Follow-up Information     [facility], [place]. Go on 10/22/2023.   Why: You are scheduled for therapy with Joen Hummer in-person on 10/22/23 at 2 PM. You will receive an email about how to register for the patient portal. Contact information: [address] [place] [address] [phone]         [facility], [place]. Go on 12/13/2023.   Why: You are scheduled for medication management for 12/13/23 at 3:30 PM VIRTUALLY.  You will receive an email about how to register for the patient portal. Contact information: [address] [place] [address] [phone]                 Follow-up recommendations:  Activity:  as tolerated    Signed: Shamia Uppal, MD 10/18/2023, 6:39 PM

## 2023-10-18 NOTE — [facility] Group Notes (Signed)
 Spirituality Group   Group Goal: Support / Education around grief and loss   Group Description: Following introductions and group rules, group members engaged in facilitated group dialog and support around topic of loss, with particular support around experiences of loss in their lives. Group members identified types of loss (relationships / self / things) as well as patterns, circumstances, and changes that precipitate loss. Reflection invited on thoughts / feelings around loss, normalized grief responses, and recognized variety in grief experience. Group noted Worden's four tasks of grief in discussion. Group drew on Adlerian / Rogerian, narrative, MI, with Yalom's group therapy as a primary framework.   Observations: Sylvia Beasley was actively engaged in the group discussion. Faith is a source of strength and meaning for her. She described hope to be with her daughter. Daughter is starting college, unclear what is goal within family. Will offer follow up support.  Sylvia Beasley L. Sylvia Beasley.Div

## 2023-10-18 NOTE — Plan of Care (Signed)
 Patient is discharged home today with husband picking up.  Patient went through her belongings and signed paperwork. Home meds also returned.

## 2023-10-18 NOTE — Progress Notes (Signed)
  [facility] Adult Case Management Discharge Plan :  Will you be returning to the same living situation after discharge:  Yes,  pt will return home  At discharge, do you have transportation home?: Yes,  pt's husband will pick her up  Do you have the ability to pay for your medications: Yes,  [employer] [facility] OTHER  Release of information consent forms completed and in the chart;  Patient's signature needed at discharge.  Patient to Follow up at:  Follow-up Information     [facility] [place]. Go on 10/22/2023.   Why: You are scheduled for therapy with Joen Hummer in-person on 10/22/23 at 2 PM. You will receive an email about how to register for the patient portal. Contact information: [address] [place] [address] [phone]         [facility] [place]. Go on 12/13/2023.   Why: You are scheduled for medication management for 12/13/23 at 3:30 PM VIRTUALLY.  You will receive an email about how to register for the patient portal. Contact information: [address] [place] [address] [phone]                 Next level of care provider has access to [facility] Link:no  Safety Planning and Suicide Prevention discussed: Yes,  Cyanna Neace, [phone]     Has patient been referred to the Quitline?: Patient does not use tobacco/nicotine products  Patient has been referred for addiction treatment: No known substance use disorder.  [address] LCSWA 10/18/2023, 9:56 AM

## 2023-10-23 ENCOUNTER — Other Ambulatory Visit: Payer: Self-pay

## 2023-10-23 ENCOUNTER — Emergency Department
Admission: EM | Admit: 2023-10-23 | Discharge: 2023-10-24 | Disposition: A | Discharge status: Other Acute Inpatient Hospital | Attending: Emergency Medicine | Admitting: Emergency Medicine

## 2023-10-23 ENCOUNTER — Encounter: Payer: Self-pay | Admitting: Emergency Medicine

## 2023-10-23 DIAGNOSIS — Z7982 Long term (current) use of aspirin: Secondary | ICD-10-CM | POA: Diagnosis not present

## 2023-10-23 DIAGNOSIS — Y900 Blood alcohol level of less than 20 mg/100 ml: Secondary | ICD-10-CM | POA: Insufficient documentation

## 2023-10-23 DIAGNOSIS — Z79899 Other long term (current) drug therapy: Secondary | ICD-10-CM | POA: Diagnosis not present

## 2023-10-23 DIAGNOSIS — F309 Manic episode, unspecified: Secondary | ICD-10-CM | POA: Diagnosis not present

## 2023-10-23 DIAGNOSIS — F29 Unspecified psychosis not due to a substance or known physiological condition: Secondary | ICD-10-CM

## 2023-10-23 DIAGNOSIS — I10 Essential (primary) hypertension: Secondary | ICD-10-CM | POA: Insufficient documentation

## 2023-10-23 LAB — URINE DRUG SCREEN, QUALITATIVE (ARMC ONLY)
Amphetamines, Ur Screen: NOT DETECTED
Barbiturates, Ur Screen: NOT DETECTED
Benzodiazepine, Ur Scrn: NOT DETECTED
Cannabinoid 50 Ng, Ur ~~LOC~~: NOT DETECTED
Cocaine Metabolite,Ur ~~LOC~~: NOT DETECTED
MDMA (Ecstasy)Ur Screen: NOT DETECTED
Methadone Scn, Ur: NOT DETECTED
Opiate, Ur Screen: NOT DETECTED
Phencyclidine (PCP) Ur S: NOT DETECTED
Tricyclic, Ur Screen: NOT DETECTED

## 2023-10-23 LAB — URINALYSIS, ROUTINE W REFLEX MICROSCOPIC
Bilirubin Urine: NEGATIVE
Glucose, UA: 50 mg/dL — AB
Hgb urine dipstick: NEGATIVE
Ketones, ur: 5 mg/dL — AB
Leukocytes,Ua: NEGATIVE
Nitrite: NEGATIVE
Protein, ur: NEGATIVE mg/dL
Specific Gravity, Urine: 1.015 (ref 1.005–1.030)
pH: 5 (ref 5.0–8.0)

## 2023-10-23 LAB — COMPREHENSIVE METABOLIC PANEL WITH GFR
ALT: 24 U/L (ref 0–44)
AST: 30 U/L (ref 15–41)
Albumin: 3.7 g/dL (ref 3.5–5.0)
Alkaline Phosphatase: 81 U/L (ref 38–126)
Anion gap: 15 (ref 5–15)
BUN: 15 mg/dL (ref 6–20)
CO2: 21 mmol/L — ABNORMAL LOW (ref 22–32)
Calcium: 9.1 mg/dL (ref 8.9–10.3)
Chloride: 101 mmol/L (ref 98–111)
Creatinine, Ser: 0.74 mg/dL (ref 0.44–1.00)
GFR, Estimated: >60 mL/min (ref >60)
Glucose, Bld: 156 mg/dL — ABNORMAL HIGH (ref 70–99)
Potassium: 3.5 mmol/L (ref 3.5–5.1)
Sodium: 137 mmol/L (ref 135–145)
Total Bilirubin: <0.2 mg/dL (ref 0.0–1.2)
Total Protein: 8.2 g/dL — ABNORMAL HIGH (ref 6.5–8.1)

## 2023-10-23 LAB — CBC
HCT: 29.8 % — ABNORMAL LOW (ref 36.0–46.0)
Hemoglobin: 9.1 g/dL — ABNORMAL LOW (ref 12.0–15.0)
MCH: 24.3 pg — ABNORMAL LOW (ref 26.0–34.0)
MCHC: 30.5 g/dL (ref 30.0–36.0)
MCV: 79.7 fL — ABNORMAL LOW (ref 80.0–100.0)
Platelets: 470 K/uL — ABNORMAL HIGH (ref 150–400)
RBC: 3.74 MIL/uL — ABNORMAL LOW (ref 3.87–5.11)
RDW: 14.9 % (ref 11.5–15.5)
WBC: 7.4 K/uL (ref 4.0–10.5)
nRBC: 0 % (ref 0.0–0.2)

## 2023-10-23 LAB — ETHANOL: Alcohol, Ethyl (B): <15 mg/dL (ref <15)

## 2023-10-23 MED ORDER — METOPROLOL SUCCINATE ER 25 MG PO TB24
25.0000 mg | ORAL_TABLET | Freq: Every day | ORAL | Status: DC
  Filled 2023-10-23: qty 1

## 2023-10-23 MED ORDER — OLANZAPINE 10 MG IM SOLR
5.0000 mg | Freq: Once | INTRAMUSCULAR | Status: AC
  Administered 2023-10-23: 5 mg via INTRAMUSCULAR
  Filled 2023-10-23: qty 10

## 2023-10-23 MED ORDER — RISPERIDONE 1 MG PO TBDP
1.0000 mg | ORAL_TABLET | Freq: Every day | ORAL | Status: DC
  Filled 2023-10-23: qty 1

## 2023-10-23 MED ORDER — LOSARTAN POTASSIUM 50 MG PO TABS
50.0000 mg | ORAL_TABLET | Freq: Every day | ORAL | Status: DC
  Filled 2023-10-23: qty 1

## 2023-10-23 NOTE — ED Notes (Signed)
 IVC PENDING  CONSULT ?

## 2023-10-23 NOTE — BH Assessment (Signed)
 Adult/GERO MH  Referral information for Psychiatric Hospitalization faxed to:   [facility] ([phone])   [facility] [phone]- [phone], [phone])   [facility] [phone] or [phone]),  . [facility] [phone]- [phone]),

## 2023-10-23 NOTE — ED Notes (Addendum)
 [address], [facility] called and has given pt a bed at their facility.
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
## 2023-10-23 NOTE — ED Notes (Signed)
Patient in bathroom at this time.
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
## 2023-10-23 NOTE — ED Notes (Signed)
 RN taking over care of pt at this time after receiving handoff. Pt ABCs intact. RR even and unlabored. Pt in NAD. Bed in lowest locked position.

## 2023-10-23 NOTE — Consult Note (Signed)
 [facility] Psychiatric Consult Initial  Patient Name: .Sylvia Beasley  MRN: [id number]  DOB: 1965/02/27  Consult Order details:  Orders (From admission, onward)     Start     Ordered   10/23/23 0800  IP CONSULT TO PSYCHIATRY       Ordering Provider: Dicky Anes, MD  Provider:  (Not yet assigned)  Question Answer Comment  Reason for consult: Other (see comments)   Comments: ivc, mania      10/23/23 0759   10/23/23 0800  CONSULT TO CALL ACT TEAM       Ordering Provider: Dicky Anes, MD  Provider:  (Not yet assigned)  Question:  Reason for Consult?  Answer:  mania, ivc   10/23/23 0759             Mode of Visit: In person    Psychiatry Consult Evaluation  Service Date: October 23, 2023 LOS:  LOS: 0 days  Chief Complaint You know what happened  Primary Psychiatric Diagnoses  Psychosis   Assessment  Sylvia Beasley is a 58 y.o. female admitted: Presented to the ED Per EDP note: Sylvia Beasley is a 58 y.o. female   who has a history of hypertension rheumatoid arthritis, recently admitted to psychiatry and treated for concerns of new onset of mania felt possibly related to use of methotrexate .   EM caveat the patient altered mental status, perseverating on cameras and neighbors   Petition notes the patient was recently released after commitment at [facility].  Patient has become paranoid feel somewhat striking her delusional and has felt that mental health staff have attempted to poison her   Patient does not give additional history aside from telling us  that we need to look at the cameras and that she needs to see her neighborhood.  She is very fixated on cameras , her neighbors.   normal temp  On exam today, patient irritable with psychiatry team and provided limited history and information.  Patient was noted to cut interview early stating that is enough questions.  Patient was recently admitted to [facility] geropsych unit for similar presentation.  Patient reported  she had not been taking her medications at home since discharge from unit.  Today she was fixated on seeing her daughter Sylvia Beasley.  She currently denied suicidal or homicidal ideations.  She denied any auditory or visual hallucinations.  However, patient appeared paranoid of interviewers on exam and per IVC papers patient was paranoid that [facility] providers were poisoning her and paranoid of her neighbors.  At this time recommendations to continue IVC and move forward with inpatient psychiatric admission once medically cleared for further management and stabilization of symptoms.    Diagnoses:  Active Hospital problems: Active Problems:   Psychosis [facility])    Plan   ## Psychiatric Medication Recommendations:  -Restarted patient home risperidone 1mg  nightly  ## Medical Decision Making Capacity: Not specifically addressed in this encounter  ## Further Work-up:   -- most recent EKG on 10/23/2023 had QtC of 398 -- Pertinent labwork reviewed earlier this admission includes: Urinalysis, cbc, cmp, ethanol, urine drug screen   ## Disposition:-- We recommend inpatient psychiatric hospitalization after medical hospitalization. Patient has been involuntarily committed on 10/23/2023.   ## Behavioral / Environmental: -Utilize compassion and acknowledge the patient's experiences while setting clear and realistic expectations for care.    ## Safety and Observation Level:  - Based on my clinical evaluation, I estimate the patient to be at low risk of self harm in the  current setting. - At this time, we recommend  routine. This decision is based on my review of the chart including patient's history and current presentation, interview of the patient, mental status examination, and consideration of suicide risk including evaluating suicidal ideation, plan, intent, suicidal or self-harm behaviors, risk factors, and protective factors. This judgment is based on our ability to directly address suicide risk,  implement suicide prevention strategies, and develop a safety plan while the patient is in the clinical setting. Please contact our team if there is a concern that risk level has changed.  CSSR Risk Category:   Suicide Risk Assessment: Patient has following modifiable risk factors for suicide: medication noncompliance, which we are addressing by recommending inpatient psychiatric admission for further monitoring and symptom stabilization. Patient has following non-modifiable or demographic risk factors for suicide: psychiatric hospitalization Patient has the following protective factors against suicide: Supportive family  Thank you for this consult request. Recommendations have been communicated to the primary team.  We will sign off at this time.   Zelda Sharps, NP        History of Present Illness  Relevant Aspects of Hospital ED   Patient Report:  On exam today, patient irritable with psychiatry team and provided limited history and information.  Patient was noted to cut off interview early stating that is enough questions.  Patient was recently admitted to [facility] geropsych unit for similar presentation.  Patient reported she had not been taking her medications at home since discharge from unit.  Today she was fixated on seeing her daughter Sylvia Carne.  She currently denied suicidal or homicidal ideations.  She denied any auditory or visual hallucinations.  However, patient appeared paranoid of interviewers on exam and per IVC papers patient was paranoid that [facility] providers were poisoning her and paranoid of her neighbors.  At this time recommendations to continue IVC and move forward with inpatient psychiatric admission once medically cleared for further management and stabilization of symptoms.  Patient was alert and oriented to self, year and month.  Patient did report that the police brought her in, but would not elaborate on the events that led to the police being involved.  She became  irritable when psychiatry attempted to ask her about these events.  When asked if compliant with medications that she was discharged with from geropsych unit, patient stated no meds they took my blood and I have not taken medicines at home.  Patient was unable to list medications that were prescribed to her from her recent admission at geropsych unit.  Psych ROS:  Depression: UTA Anxiety:  UTA Mania (lifetime and current): UTA Psychosis: (lifetime and current): Paranoia present  Collateral information:  Patient refused to give consent at this time to contact anyone but her daughter, but only wanted to see daughter face to face, not for psychiatry team to speak with her. Per patient, daughter is current Consulting civil engineer at [employer].    Psychiatric and Social History  Psychiatric History:  Information collected from Chart review  Prev Dx/Sx: Psychosis Current Psych Provider: Was supposed to follow up with [facility] on 10/20 Home Meds (current): Risperidone Previous Med Trials: Zyprexa-made her too sleepy Therapy: Was supposed to follow up with [facility]   Prior Psych Hospitalization: Yes  Prior Self Harm: Denied Prior Violence: Unknown  Family Psych History: UTA Family Hx suicide: UTA  Social History:   Educational Hx: UTA Occupational Hx: UTA Legal Hx: UTA Living Situation: With husband at previous geropsych visit Spiritual  Hx: UTA Access to weapons/lethal means: UTA   Substance History Alcohol: Denied  Tobacco: Denied Illicit drugs: Denied Prescription drug abuse: Denied Rehab hx: Denied  Exam Findings  Physical Exam: Deferred to EDP- note reviewed   Vital Signs:  Temp:  [98.2 F (36.8 C)-98.3 F (36.8 C)] 98.3 F (36.8 C) (10/21 1056) Pulse Rate:  [100-115] 102 (10/21 1056) Resp:  [16-18] 16 (10/21 1056) BP: (157-191)/(98-100) 157/100 (10/21 0945) SpO2:  [100 %] 100 % (10/21 0945) Weight:  [58 kg] 58 kg (10/21 0714) Blood pressure (!) 157/100,  pulse (!) 102, temperature 98.3 F (36.8 C), temperature source Oral, resp. rate 16, height 5' 3 (1.6 m), weight 58 kg, last menstrual period 05/22/2015, SpO2 100%. Body mass index is 22.65 kg/m.    Mental Status Exam: General Appearance: Fairly Groomed  Orientation:  Full (Time, Place, and Person)  Memory:  Immediate;   Fair Recent;   Poor Remote;   Fair  Concentration:  Concentration: Poor and Attention Span: Poor  Recall:  Poor  Attention  Poor  Eye Contact:  Minimal  Speech:  Clear and Coherent  Language:  Fair  Volume:  Normal  Mood: That's enough  Affect:  Blunt  Thought Process:  Coherent  Thought Content:  Paranoid Ideation  Suicidal Thoughts:  No  Homicidal Thoughts:  No  Judgement:  Impaired  Insight:  Lacking  Psychomotor Activity:  Normal  Akathisia:  No  Fund of Knowledge:  Fair      Assets:  Social Support  Cognition:  Impaired,  Mild  ADL's:  Intact  AIMS (if indicated):        Other History   These have been pulled in through the EMR, reviewed, and updated if appropriate.  Family History:  The patient's family history includes Arthritis in her father; Asthma in her sister; Breast cancer (age of onset: 96) in her sister; Dementia in her father; Diabetes in her mother, sister, and sister; Glaucoma in her sister; Kidney disease in her mother; Pancreatic cancer in her paternal uncle; Throat cancer in her paternal grandmother.  Medical History: Past Medical History:  Diagnosis Date   Allergy    Anemia    Arthritis    rheumatiod   Hypertension    Pre-diabetes     Surgical History: Past Surgical History:  Procedure Laterality Date   BREAST BIOPSY     BREAST CYST ASPIRATION Left    x 2   COLONOSCOPY WITH PROPOFOL  N/A 09/22/2021   Procedure: COLONOSCOPY WITH PROPOFOL ;  Surgeon: Therisa Bi, MD;  Location: [facility] END[facility];  Service: Gastroenterology;  Laterality: N/A;   EYE SURGERY Left 1980   stye removal   FOOT SURGERY Left 08/22/2023    HALLUX VALGUS AKIN Left 08/22/2023   Procedure: CORRECTION, HALLUX VALGUS, WITH AKIN OSTEOTOMY;  Surgeon: Ashley Soulier, DPM;  Location: [facility];  Service: Orthopedics/Podiatry;  Laterality: Left;   HALLUX VALGUS LAPIDUS Left 08/22/2023   Procedure: ROMAYNE LOOK;  Surgeon: Ashley Soulier, DPM;  Location: [facility];  Service: Orthopedics/Podiatry;  Laterality: Left;   TUBAL LIGATION  2007     Medications:   Current Facility-Administered Medications:    losartan  (COZAAR ) tablet 50 mg, 50 mg, Oral, Daily, Quale, Mark, MD, 50 mg at 10/23/23 1202   metoprolol  succinate (TOPROL -XL) 24 hr tablet 25 mg, 25 mg, Oral, Daily, Quale, Mark, MD, 25 mg at 10/23/23 1202   risperiDONE (RISPERDAL M-TABS) disintegrating tablet 1 mg, 1 mg, Oral, QHS, Naviah Belfield B, NP  Current Outpatient  Medications:    ACCU-CHEK GUIDE TEST test strip, as directed., Disp: , Rfl:    aspirin  EC 81 MG tablet, Take 1 tablet (81 mg total) by mouth in the morning and at bedtime. Swallow whole. (Patient not taking: Reported on 10/10/2023), Disp: 60 tablet, Rfl: 12   BLACK COHOSH COMPOUND PO, Take 1 tablet by mouth at bedtime., Disp: , Rfl:    Cats Claw, Uncaria Tomentosa, (CATS CLAW PO), Take 1 tablet by mouth daily., Disp: , Rfl:    folic acid  (FOLVITE ) 1 MG tablet, Take 1 mg by mouth daily., Disp: , Rfl:    losartan  (COZAAR ) 50 MG tablet, TAKE 1 TABLET BY MOUTH EVERY DAY, Disp: 90 tablet, Rfl: 1   meloxicam  (MOBIC ) 15 MG tablet, Take 1 tablet (15 mg total) by mouth daily., Disp: 30 tablet, Rfl: 1   metoprolol  succinate (TOPROL -XL) 25 MG 24 hr tablet, TAKE 1 TABLET (25 MG TOTAL) BY MOUTH DAILY., Disp: 90 tablet, Rfl: 1   Moringa 500 MG CAPS, Take 1,500 mg by mouth., Disp: , Rfl:    Multiple Vitamin (MULTIVITAMIN WITH MINERALS) TABS tablet, Take 1 tablet by mouth daily., Disp: 30 tablet, Rfl: 0   naproxen (NAPROSYN) 250 MG tablet, Take 1 tablet (250 mg total) by mouth 3 (three) times daily with meals.,  Disp: 90 tablet, Rfl: 0   pyridOXINE (VITAMIN B6) 25 MG tablet, Take 25 mg by mouth daily., Disp: , Rfl:    risperiDONE (RISPERDAL) 1 MG tablet, Take 1 tablet (1 mg total) by mouth at bedtime., Disp: 30 tablet, Rfl: 0   traZODone  (DESYREL ) 50 MG tablet, Take 1 tablet (50 mg total) by mouth at bedtime., Disp: 30 tablet, Rfl: 0   triamcinolone cream (KENALOG) 0.5 %, Apply topically. (Patient not taking: Reported on 10/10/2023), Disp: , Rfl:    Turmeric (QC TUMERIC COMPLEX) 500 MG CAPS, Take 1,000 mg by mouth., Disp: , Rfl:   Allergies: Allergies  Allergen Reactions   Shellfish Allergy Shortness Of Breath   Zyrtec [Cetirizine] Shortness Of Breath   Ace Inhibitors Other (See Comments)   Ipratropium Bromide Other (See Comments)   Milk-Related Compounds    Fexofenadine Rash    Allegra   Rofecoxib Rash    Vioxx    Sonnie Pawloski, NP

## 2023-10-23 NOTE — BH Assessment (Signed)
 Patient has been accepted to [facility].  Accepting physician is Dr. Odella Hoots.  Call report to [phone].  Representative was [employer].   ER Staff is aware of it:  Jerel, ER Secretary  Dr. Claudene, ER MD  Leonidas, Patient's Nurse     Patient can arrive at facility 10/24/23 after 8 AM.

## 2023-10-23 NOTE — ED Notes (Signed)
 Patient sitting on side of bed rocking back and fourth.

## 2023-10-23 NOTE — ED Notes (Signed)
 Patient sleeping

## 2023-10-23 NOTE — ED Notes (Signed)
 Patient is resting comfortably.

## 2023-10-23 NOTE — ED Triage Notes (Addendum)
 Patient to ED via PD IVC'd due to pt being paranoid that someone is tracking her and that she has not slept and that the [facility] staff has positioned her. Recently released from hospital for same.  PT unwilling to answer this RN's questions including the suicide screening. PT unwilling to let staff get temperature at this time. PT pulling away while attempting to get blood work.

## 2023-10-23 NOTE — ED Notes (Signed)
 Patient refusing to be dressed out in triage.

## 2023-10-23 NOTE — ED Notes (Signed)
 Pt requesting bottled water. Pt informed we are unable to give her bottled water and gave her cup of water instead.
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
## 2023-10-23 NOTE — ED Provider Notes (Signed)
 Vitals:   10/23/23 0945 10/23/23 1056  BP: (!) 157/100   Pulse: 100 (!) 102  Resp: 18 16  Temp: 98.2 F (36.8 C) 98.3 F (36.8 C)  SpO2: 100%      Ongoing care assigned to Dr. Claudene, psychiatry currently evaluating but have not provided final recommendations.  Patient under involuntary commitment.  Follow-up on psychiatry recommendation   Dicky Anes, MD 10/23/23 1540

## 2023-10-23 NOTE — ED Notes (Signed)
 IVC  CONSULT  DONE  PENDING  PLACEMENT

## 2023-10-24 LAB — URINE CULTURE: Culture: NO GROWTH

## 2023-10-24 NOTE — ED Notes (Signed)
 Attempted to give report to [facility] for pt, unable to reach staff.
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
## 2023-10-24 NOTE — ED Notes (Signed)
[facility]  called  for transport  to  [facility]

## 2023-10-24 NOTE — ED Notes (Signed)
 IVC/Pt accepted to [facility], can arrive after 8am 10/24/23

## 2023-10-24 NOTE — ED Notes (Signed)
EMTALA reviewed. 

## 2023-10-29 ENCOUNTER — Telehealth: Payer: Self-pay

## 2023-10-29 NOTE — Telephone Encounter (Signed)
 Copied from CRM [id number]. Topic: Clinical - Medical Advice >> Oct 29, 2023  8:37 AM Delon HERO wrote: Reason for CRM: Patient's husband Tacara Hadlock, NOT ON DPR, is calling to report that the patient has been have manic behaviors about 3 weeks ago. Patient was involuntary committed on 10/23/2023. And also involuntary committed again [facility] [place], [place] Patient will be discharged today.  Husband is concerned. Scheduled appointment tomorrow at 1p. Unsure if patient will come.

## 2023-10-30 ENCOUNTER — Ambulatory Visit: Admitting: Family Medicine

## 2023-10-30 NOTE — Telephone Encounter (Signed)
 Noted

## 2023-12-04 ENCOUNTER — Telehealth: Payer: Self-pay

## 2023-12-04 DIAGNOSIS — M0579 Rheumatoid arthritis with rheumatoid factor of multiple sites without organ or systems involvement: Secondary | ICD-10-CM

## 2023-12-04 NOTE — Telephone Encounter (Signed)
 Copied from CRM [id number]. Topic: Referral - Request for Referral >> Dec 04, 2023  9:15 AM Larissa RAMAN wrote: Did the patient discuss referral with their provider in the last year? Yes (If No - schedule appointment) (If Yes - send message)  Appointment offered? Yes  Type of order/referral and detailed reason for visit: rheumatoid arthritis  Preference of office, provider, location: [facility], [place]  If referral order, have you been seen by this specialty before? Yes DR. Patel  (If Yes, this issue or another issue? When? Where?  Can we respond through MyChart? No

## 2023-12-04 NOTE — Telephone Encounter (Signed)
 Ok to send referral as requested - Rheum for RA, put [facility] in the comments of the referral

## 2023-12-25 ENCOUNTER — Telehealth: Payer: Self-pay | Admitting: Family Medicine

## 2023-12-25 NOTE — Telephone Encounter (Unsigned)
 Copied from CRM [id number]. Topic: Clinical - Medication Refill >> Dec 25, 2023  2:12 PM Cynthia K wrote: Medication:  metoprolol  succinate (TOPROL -XL) 25 MG 24 hr tablet   Has the patient contacted their pharmacy? Yes (Agent: If no, request that the patient contact the pharmacy for the refill. If patient does not wish to contact the pharmacy document the reason why and proceed with request.) (Agent: If yes, when and what did the pharmacy advise?) Pharmacy needs order to refill  This is the patient's preferred pharmacy:  [id number] - [place], [place] - [address] [place] [address] Phone: [phone] Fax: [phone]  Is this the correct pharmacy for this prescription? Yes If no, delete pharmacy and type the correct one.   Has the prescription been filled recently? No  Is the patient out of the medication? Yes - she has been out for 2 days  Has the patient been seen for an appointment in the last year OR does the patient have an upcoming appointment? Yes  Can we respond through MyChart? No  Agent: Please be advised that Rx refills may take up to 3 business days. We ask that you follow-up with your pharmacy.

## 2023-12-25 NOTE — Telephone Encounter (Signed)
 Patient requesting refill of metoprolol  succinate (TOPROL -XL) 25 MG 24 hr tablet. Unable to pend medication.

## 2023-12-31 ENCOUNTER — Ambulatory Visit (INDEPENDENT_AMBULATORY_CARE_PROVIDER_SITE_OTHER): Admitting: Family Medicine

## 2023-12-31 ENCOUNTER — Encounter: Payer: Self-pay | Admitting: Family Medicine

## 2023-12-31 VITALS — BP 142/94 | HR 81 | Resp 14 | Ht 63.0 in | Wt 117.3 lb

## 2023-12-31 DIAGNOSIS — M0579 Rheumatoid arthritis with rheumatoid factor of multiple sites without organ or systems involvement: Secondary | ICD-10-CM

## 2023-12-31 DIAGNOSIS — I10 Essential (primary) hypertension: Secondary | ICD-10-CM | POA: Diagnosis not present

## 2023-12-31 DIAGNOSIS — D509 Iron deficiency anemia, unspecified: Secondary | ICD-10-CM | POA: Insufficient documentation

## 2023-12-31 DIAGNOSIS — R7303 Prediabetes: Secondary | ICD-10-CM | POA: Diagnosis not present

## 2023-12-31 DIAGNOSIS — E559 Vitamin D deficiency, unspecified: Secondary | ICD-10-CM | POA: Diagnosis not present

## 2023-12-31 DIAGNOSIS — R634 Abnormal weight loss: Secondary | ICD-10-CM

## 2023-12-31 DIAGNOSIS — E538 Deficiency of other specified B group vitamins: Secondary | ICD-10-CM | POA: Diagnosis not present

## 2023-12-31 NOTE — Assessment & Plan Note (Signed)
 Concern for prediabetes contributing to weight loss. Last A1c check was several months ago. - Ordered labs to check A1c and blood sugar levels.

## 2023-12-31 NOTE — Assessment & Plan Note (Signed)
 Blood pressure elevated at 140/94 mmHg, possibly due to stress and recent travel. Currently on metoprolol  25 mg and losartan  50 mg. Discussed potential need to adjust medication if home blood pressure readings remain high. - Recheck blood pressure at home and report readings. - Will consider increasing losartan  if home readings remain high. - Scheduled follow-up in 4-6 weeks to reassess blood pressure management.

## 2023-12-31 NOTE — Progress Notes (Signed)
 "     Acute visit   Patient: Sylvia Beasley   DOB: 04-17-1965   58 y.o. Female  MRN: [id number] PCP: Myrla Jon HERO, MD   Chief Complaint  Patient presents with   Acute Visit    Arthritis- bilateral feet, knee, elbow very painful ongoing since July   Weight Loss    Pt main concern: has lost a lot of weight last OV 09/11/23 pt weighed 136lb   Subjective    Discussed the use of AI scribe software for clinical note transcription with the patient, who gave verbal consent to proceed.  History of Present Illness   Sylvia Beasley is a 58 year old female with rheumatoid arthritis who presents for management of her condition and medication review.  She stopped methotrexate  after three weeks due to aggression and paranoia that required hospitalization. She is taking no RA medications, including Enbrel, because of concern about side effects such as cancer.  She is managing symptoms with a vegetarian diet and exercise. She reports losing about three pounds per week since RA onset, from 147 to about 115 pounds, and is worried about this weight loss.  She lifts weights and feels her joints are about 90% better with this regimen. Over the past week she has had drenching night sweats requiring towel use.  During a prior hospitalization she was found to have low vitamin B12, anemia, and low vitamin D . She now takes B-complex, B12, and a multivitamin for adults over 50.  She has elevated blood pressure with recent readings around 140-142/94. She takes metoprolol  25 mg and losartan  50 mg. She reports significant stress from a divorce and thinks this is contributing to her blood pressure.  She has prediabetes and is concerned that her recent weight loss, increased sweating, and night sweats could be related to her thyroid or another medical problem.        Review of Systems  Objective    BP (!) 142/94   Pulse 81   Resp 14   Ht 5' 3 (1.6 m)   Wt 117 lb 4.8 oz (53.2 kg)   LMP 05/22/2015   SpO2  100%   BMI 20.78 kg/m  Physical Exam Vitals reviewed.  Constitutional:      General: She is not in acute distress.    Appearance: Normal appearance. She is well-developed. She is not diaphoretic.  HENT:     Head: Normocephalic and atraumatic.  Eyes:     General: No scleral icterus.    Conjunctiva/sclera: Conjunctivae normal.  Neck:     Thyroid: No thyromegaly.  Cardiovascular:     Rate and Rhythm: Normal rate and regular rhythm.     Heart sounds: Normal heart sounds. No murmur heard. Pulmonary:     Effort: Pulmonary effort is normal. No respiratory distress.     Breath sounds: Normal breath sounds. No wheezing, rhonchi or rales.  Musculoskeletal:     Cervical back: Neck supple.     Right lower leg: No edema.     Left lower leg: No edema.  Lymphadenopathy:     Cervical: No cervical adenopathy.  Skin:    General: Skin is warm and dry.  Neurological:     Mental Status: She is alert and oriented to person, place, and time. Mental status is at baseline.  Psychiatric:        Mood and Affect: Mood normal.        Behavior: Behavior normal.       No results found for  any visits on 12/31/23.  Assessment & Plan     Problem List Items Addressed This Visit       Cardiovascular and Mediastinum   Primary hypertension   Blood pressure elevated at 140/94 mmHg, possibly due to stress and recent travel. Currently on metoprolol  25 mg and losartan  50 mg. Discussed potential need to adjust medication if home blood pressure readings remain high. - Recheck blood pressure at home and report readings. - Will consider increasing losartan  if home readings remain high. - Scheduled follow-up in 4-6 weeks to reassess blood pressure management.      Relevant Orders   Comprehensive metabolic panel with GFR     Musculoskeletal and Integument   Rheumatoid arthritis involving multiple sites with positive rheumatoid factor (HCC)   Rheumatoid arthritis with positive rheumatoid factor, previously  managed with methotrexate , which was discontinued due to adverse effects including aggression and paranoia. Currently not on any medication. Reports significant improvement in joint symptoms with dietary changes and exercise, including weight lifting and stretching. Concerns about potential side effects of Enbrel, including cancer risk, and prefers natural healing methods. Discussed the role of biologics like Enbrel in managing rheumatoid arthritis by lowering the immune system to prevent joint damage. Emphasized the importance of having a rheumatologist for future management and the potential need for Enbrel if symptoms worsen. - Continue dietary changes and exercise regimen. - Follow up with rheumatologist for further management and consideration of Enbrel if symptoms worsen.      Relevant Medications   naproxen  (NAPROSYN ) 250 MG tablet     Other   Prediabetes   Concern for prediabetes contributing to weight loss. Last A1c check was several months ago. - Ordered labs to check A1c and blood sugar levels.       Relevant Orders   Hemoglobin A1c   Microcytic anemia - Primary   Previously diagnosed with iron deficiency anemia, currently supplementing with iron, spinach, and tofu. Reports feeling better and no longer experiencing fatigue. - Ordered labs to recheck anemia status and iron levels.      Relevant Orders   CBC w/Diff/Platelet   Iron, TIBC and Ferritin Panel   Other Visit Diagnoses       B12 deficiency       Relevant Orders   B12     Avitaminosis D       Relevant Orders   VITAMIN D  25 Hydroxy (Vit-D Deficiency, Fractures)     Weight loss       Relevant Orders   TSH           Abnormal weight loss Significant weight loss from 147 lbs to 115 lbs, attributed to rheumatoid arthritis and dietary changes. Reports losing 3 lbs per week prior to dietary changes. Weight loss may also be influenced by stress and recent divorce. Differential diagnosis includes rheumatoid  arthritis, thyroid dysfunction, and prediabetes. - Ordered labs to check thyroid function, kidney and liver function, and blood sugar levels. - Monitor weight and dietary intake.  Vitamin B12 deficiency Previously diagnosed with vitamin B12 deficiency, currently supplementing with B12 and B complex vitamins. Reports feeling better. - Ordered labs to recheck vitamin B12 levels.  Vitamin D  deficiency Previously diagnosed with vitamin D  deficiency. - Ordered labs to recheck vitamin D  levels.       No orders of the defined types were placed in this encounter.    Return in about 6 weeks (around 02/11/2024) for BP f/u, virtual ok.      Meshell Abdulaziz,  MD  [facility] [phone] (phone) [phone] (fax)  [facility]  "

## 2023-12-31 NOTE — Assessment & Plan Note (Signed)
 Previously diagnosed with iron deficiency anemia, currently supplementing with iron, spinach, and tofu. Reports feeling better and no longer experiencing fatigue. - Ordered labs to recheck anemia status and iron levels.

## 2023-12-31 NOTE — Assessment & Plan Note (Signed)
 Rheumatoid arthritis with positive rheumatoid factor, previously managed with methotrexate , which was discontinued due to adverse effects including aggression and paranoia. Currently not on any medication. Reports significant improvement in joint symptoms with dietary changes and exercise, including weight lifting and stretching. Concerns about potential side effects of Enbrel, including cancer risk, and prefers natural healing methods. Discussed the role of biologics like Enbrel in managing rheumatoid arthritis by lowering the immune system to prevent joint damage. Emphasized the importance of having a rheumatologist for future management and the potential need for Enbrel if symptoms worsen. - Continue dietary changes and exercise regimen. - Follow up with rheumatologist for further management and consideration of Enbrel if symptoms worsen.

## 2024-01-01 ENCOUNTER — Ambulatory Visit: Payer: Self-pay | Admitting: Family Medicine

## 2024-01-01 LAB — COMPREHENSIVE METABOLIC PANEL WITH GFR
ALT: 18 IU/L (ref 0–32)
AST: 24 IU/L (ref 0–40)
Albumin: 4.2 g/dL (ref 3.8–4.9)
Alkaline Phosphatase: 82 IU/L (ref 49–135)
BUN/Creatinine Ratio: 11 — ABNORMAL LOW (ref 12–28)
BUN: 8 mg/dL (ref 8–27)
Bilirubin Total: 0.2 mg/dL (ref 0.0–1.2)
CO2: 23 mmol/L (ref 20–29)
Calcium: 10.2 mg/dL (ref 8.7–10.3)
Chloride: 98 mmol/L (ref 96–106)
Creatinine, Ser: 0.76 mg/dL (ref 0.57–1.00)
Globulin, Total: 3.3 g/dL (ref 1.5–4.5)
Glucose: 124 mg/dL — ABNORMAL HIGH (ref 70–99)
Potassium: 4.2 mmol/L (ref 3.5–5.2)
Sodium: 138 mmol/L (ref 134–144)
Total Protein: 7.5 g/dL (ref 6.0–8.5)
eGFR: 90 mL/min/1.73

## 2024-01-01 LAB — CBC WITH DIFFERENTIAL/PLATELET
Basophils Absolute: 0 x10E3/uL (ref 0.0–0.2)
Basos: 1 %
EOS (ABSOLUTE): 0.1 x10E3/uL (ref 0.0–0.4)
Eos: 2 %
Hematocrit: 31.4 % — ABNORMAL LOW (ref 34.0–46.6)
Hemoglobin: 9.6 g/dL — ABNORMAL LOW (ref 11.1–15.9)
Immature Grans (Abs): 0 x10E3/uL (ref 0.0–0.1)
Immature Granulocytes: 0 %
Lymphocytes Absolute: 1.1 x10E3/uL (ref 0.7–3.1)
Lymphs: 19 %
MCH: 23.1 pg — ABNORMAL LOW (ref 26.6–33.0)
MCHC: 30.6 g/dL — ABNORMAL LOW (ref 31.5–35.7)
MCV: 76 fL — ABNORMAL LOW (ref 79–97)
Monocytes Absolute: 0.4 x10E3/uL (ref 0.1–0.9)
Monocytes: 6 %
Neutrophils Absolute: 4.2 x10E3/uL (ref 1.4–7.0)
Neutrophils: 72 %
Platelets: 644 x10E3/uL — ABNORMAL HIGH (ref 150–450)
RBC: 4.15 x10E6/uL (ref 3.77–5.28)
RDW: 14.4 % (ref 11.7–15.4)
WBC: 5.8 x10E3/uL (ref 3.4–10.8)

## 2024-01-01 LAB — HEMOGLOBIN A1C
Est. average glucose Bld gHb Est-mCnc: 114 mg/dL
Hgb A1c MFr Bld: 5.6 % (ref 4.8–5.6)

## 2024-01-01 LAB — IRON,TIBC AND FERRITIN PANEL
Ferritin: 241 ng/mL — ABNORMAL HIGH (ref 15–150)
Iron Saturation: 9 % — CL (ref 15–55)
Iron: 30 ug/dL (ref 27–159)
Total Iron Binding Capacity: 330 ug/dL (ref 250–450)
UIBC: 300 ug/dL (ref 131–425)

## 2024-01-01 LAB — VITAMIN B12: Vitamin B-12: >2000 pg/mL — ABNORMAL HIGH (ref 232–1245)

## 2024-01-01 LAB — TSH: TSH: 2.69 u[IU]/mL (ref 0.450–4.500)

## 2024-01-01 LAB — VITAMIN D 25 HYDROXY (VIT D DEFICIENCY, FRACTURES): Vit D, 25-Hydroxy: 58 ng/mL (ref 30.0–100.0)

## 2024-01-30 ENCOUNTER — Telehealth: Payer: Self-pay

## 2024-01-30 DIAGNOSIS — M0579 Rheumatoid arthritis with rheumatoid factor of multiple sites without organ or systems involvement: Secondary | ICD-10-CM

## 2024-01-30 NOTE — Telephone Encounter (Signed)
 Copied from CRM #[id number]. Topic: Referral - Question >> Jan 30, 2024 12:47 PM Tonda B wrote: Reason for CRM: patient Is calling in needing a new referral for   RHEUMATOLOGY the location that she was referred to do not have any openings until 09/2024 please contact patient [phone]

## 2024-01-31 NOTE — Telephone Encounter (Signed)
 New order placed with request to be sent to a [facility] provider.

## 2024-01-31 NOTE — Telephone Encounter (Signed)
 Ok to send Rheum referral. Use same diagnosis from last referral

## 2024-01-31 NOTE — Telephone Encounter (Signed)
 Spoke with patient to advise that I see she has an appointment with [facility] in April 2026 before I place a new referral. She reports she was not aware of that as they told her September and that it was communicated that she would need to pay $250 at time of visit. She reports that is too much and would rather not see [facility]. She is requesting to be seen by someone under [facility]. I verbalized understanding.

## 2024-02-19 ENCOUNTER — Ambulatory Visit: Admitting: Family Medicine

## 2024-03-13 ENCOUNTER — Ambulatory Visit: Admitting: Family Medicine
